# Patient Record
Sex: Female | Born: 1970 | Race: White | Hispanic: No | Smoking: Never smoker
Health system: Southern US, Community
[De-identification: ages and names within clinical notes are randomized; demographics above are authoritative.]

## PROBLEM LIST (undated history)

## (undated) DIAGNOSIS — T7840XA Allergy, unspecified, initial encounter: Secondary | ICD-10-CM

## (undated) DIAGNOSIS — K649 Unspecified hemorrhoids: Secondary | ICD-10-CM

## (undated) DIAGNOSIS — M419 Scoliosis, unspecified: Secondary | ICD-10-CM

## (undated) DIAGNOSIS — I341 Nonrheumatic mitral (valve) prolapse: Secondary | ICD-10-CM

## (undated) DIAGNOSIS — E538 Deficiency of other specified B group vitamins: Secondary | ICD-10-CM

## (undated) HISTORY — DX: Unspecified hemorrhoids: K64.9

## (undated) HISTORY — DX: Deficiency of other specified B group vitamins: E53.8

## (undated) HISTORY — DX: Allergy, unspecified, initial encounter: T78.40XA

## (undated) HISTORY — DX: Nonrheumatic mitral (valve) prolapse: I34.1

## (undated) HISTORY — DX: Scoliosis, unspecified: M41.9

## (undated) HISTORY — PX: FEMORAL HERNIA REPAIR: SUR1179

---

## 1995-12-25 ENCOUNTER — Encounter: Payer: Self-pay | Admitting: Internal Medicine

## 2006-08-29 ENCOUNTER — Emergency Department: Payer: Self-pay | Admitting: Emergency Medicine

## 2009-10-09 HISTORY — PX: APPENDECTOMY: SHX54

## 2010-03-05 ENCOUNTER — Inpatient Hospital Stay: Payer: Self-pay | Admitting: General Surgery

## 2010-05-17 ENCOUNTER — Encounter: Payer: Self-pay | Admitting: Internal Medicine

## 2010-08-09 ENCOUNTER — Ambulatory Visit: Payer: Self-pay | Admitting: Internal Medicine

## 2010-08-09 ENCOUNTER — Encounter: Payer: Self-pay | Admitting: Internal Medicine

## 2010-08-09 DIAGNOSIS — I059 Rheumatic mitral valve disease, unspecified: Secondary | ICD-10-CM | POA: Insufficient documentation

## 2010-08-09 DIAGNOSIS — D649 Anemia, unspecified: Secondary | ICD-10-CM | POA: Insufficient documentation

## 2010-08-16 LAB — CONVERTED CEMR LAB
ALT: 13 units/L (ref 0–35)
AST: 22 units/L (ref 0–37)
Albumin: 4.3 g/dL (ref 3.5–5.2)
Alkaline Phosphatase: 49 units/L (ref 39–117)
Basophils Relative: 0.5 % (ref 0.0–3.0)
Bilirubin, Direct: 0.2 mg/dL (ref 0.0–0.3)
Calcium: 9.3 mg/dL (ref 8.4–10.5)
Chloride: 105 meq/L (ref 96–112)
Eosinophils Relative: 1.4 % (ref 0.0–5.0)
Ferritin: 44.5 ng/mL (ref 10.0–291.0)
Glucose, Bld: 79 mg/dL (ref 70–99)
Hemoglobin: 13.3 g/dL (ref 12.0–15.0)
Lymphocytes Relative: 31.6 % (ref 12.0–46.0)
MCHC: 35 g/dL (ref 30.0–36.0)
Neutro Abs: 3.8 10*3/uL (ref 1.4–7.7)
Neutrophils Relative %: 57 % (ref 43.0–77.0)
Phosphorus: 3.3 mg/dL (ref 2.3–4.6)
Potassium: 4.8 meq/L (ref 3.5–5.1)
RBC: 4.05 M/uL (ref 3.87–5.11)
Sodium: 138 meq/L (ref 135–145)
TSH: 0.08 microintl units/mL — ABNORMAL LOW (ref 0.35–5.50)
Total Protein: 7 g/dL (ref 6.0–8.3)
WBC: 6.6 10*3/uL (ref 4.5–10.5)

## 2010-11-10 NOTE — Assessment & Plan Note (Signed)
Summary: NEW PT TO EST/CLE   Vital Signs:  Patient profile:   40 year old female Height:      65 inches Weight:      143 pounds BMI:     23.88 Temp:     98.2 degrees F oral Pulse rate:   76 / minute Pulse rhythm:   regular BP sitting:   122 / 80  (left arm) Cuff size:   regular  Vitals Entered By: Mervin Hack CMA Duncan Dull) (August 09, 2010 12:03 PM) CC: new patient to establish care   History of Present Illness: Former patient of mine in [employer] hasn't seen a doctor in many years DId see an urgent care doctor for sinus infection in August Iron level was low and was told to follow up  Feels fine Occ tired days but not persistent peroids are monthly and regular. Heavy at first, but only uses 4 pads per day. Done after 4 days  History of MVP echo done at [facility] many years ago and did see Dr [place] but hasn't gone back occ feels fluttering in chest  Preventive Screening-Counseling & Management  Alcohol-Tobacco     Smoking Status: never  Allergies (verified): No Known Drug Allergies  Past History:  Past Medical History: Mitral valve prolapse  Past Surgical History: Left ?femoral hernia   02/25/1977 C-section x 2  0454,[id number] Appendectomy   02-25-10  Family History: Dad died of suicide in his 53's Mom fairly healthy 1 brother No history of anemia Mat GM died of MI @50  DM in paternal uncles No HTN Breast cancer on Mom's side but distant No colon cancer  Social History: Occupation: Dentist in [place] Divorced  -- 2 children. Live with her with shared custody Mom stays with her at times Never Smoked Alcohol use-no Occupation:  employed Smoking Status:  never  Review of Systems General:  Denies sleep disorder and weight loss; wears seat belt. CV:  Complains of chest pain or discomfort and palpitations; denies difficulty breathing at night, difficulty breathing while lying down, fainting, lightheadness, and shortness of breath with  exertion; occ chest pain---usually at rest walks some--just normal pace. No symptoms then. Resp:  Denies cough and shortness of breath. GI:  Denies abdominal pain, bloody stools, change in bowel habits, dark tarry stools, and indigestion. MS:  Denies joint pain and joint swelling. Derm:  Denies lesion(s) and rash. Neuro:  Complains of headaches and numbness; denies weakness; occ numbness in left hand and leg--relates to MVP Occ mild headaches. Psych:  Denies anxiety and depression; former mood problems are better now. Endo:  Denies cold intolerance and heat intolerance. Heme:  Denies abnormal bruising, bleeding, and enlarge lymph nodes.  Physical Exam  General:  alert and normal appearance.   Mouth:  no erythema, no exudates, and no lesions.   Neck:  supple, no masses, no thyromegaly, no carotid bruits, and no cervical lymphadenopathy.   Lungs:  normal respiratory effort, no intercostal retractions, no accessory muscle use, and normal breath sounds.   Heart:  normal rate, regular rhythm, no murmur, and no gallop.  Prominent mid systolic click at LLSB but no murmur Abdomen:  soft and non-tender.   Extremities:  no edema Skin:  no suspicious lesions and no ulcerations.   Psych:  normally interactive, good eye contact, not anxious appearing, and not depressed appearing.     Impression & Recommendations:  Problem # 1:  ANEMIA (ICD-285.9) Assessment Comment Only  did get records and her  Hgb was 11.5 Iron low and vitamin B12 Has been taking Flinstones vitamins with iron will recheck labs will do oral replacement if these are still low  Orders: TLB-Renal Function Panel (80069-RENAL) TLB-CBC Platelet - w/Differential (85025-CBCD) TLB-Hepatic/Liver Function Pnl (80076-HEPATIC) TLB-TSH (Thyroid Stimulating Hormone) (84443-TSH) Venipuncture (16109) TLB-IBC Pnl (Iron/FE;Transferrin) (83550-IBC) TLB-B12 + Folate Pnl (60454_09811-B14/NWG) TLB-Ferritin (82728-FER)  Problem # 2:  MITRAL  VALVE PROLAPSE (ICD-424.0) Assessment: Comment Only occ symptoms but nothing serious no audible murmur so doubt sig issue No RAE on EKG  I will try to get copy of past echo but don't anticipate needing to follow up on this  Other Orders: Tdap => 49yrs IM (95621) Admin 1st Vaccine (30865)  Patient Instructions: 1)  Please schedule a follow-up appointment in 6 months for physical   Orders Added: 1)  Tdap => 25yrs IM [90715] 2)  Admin 1st Vaccine [90471] 3)  New Patient Level IV [99204] 4)  TLB-Renal Function Panel [80069-RENAL] 5)  TLB-CBC Platelet - w/Differential [85025-CBCD] 6)  TLB-Hepatic/Liver Function Pnl [80076-HEPATIC] 7)  TLB-TSH (Thyroid Stimulating Hormone) [84443-TSH] 8)  Venipuncture [36415] 9)  TLB-IBC Pnl (Iron/FE;Transferrin) [83550-IBC] 10)  TLB-B12 + Folate Pnl [82746_82607-B12/FOL] 11)  TLB-Ferritin [78469-GEX]   Immunizations Administered:  Tetanus Vaccine:    Vaccine Type: Tdap    Site: left deltoid    Mfr: GlaxoSmithKline    Dose: 0.5 ml    Route: IM    Given by: Mervin Hack CMA (AAMA)    Exp. Date: 07/28/2012    Lot #: [id number]    VIS given: 08/26/08 version given August 09, 2010.   Immunizations Administered:  Tetanus Vaccine:    Vaccine Type: Tdap    Site: left deltoid    Mfr: GlaxoSmithKline    Dose: 0.5 ml    Route: IM    Given by: Mervin Hack CMA (AAMA)    Exp. Date: 07/28/2012    Lot #: [id number]    VIS given: 08/26/08 version given August 09, 2010.  Prior Medications: None Current Allergies (reviewed today): No known allergies

## 2010-12-19 ENCOUNTER — Encounter: Payer: Self-pay | Admitting: Internal Medicine

## 2011-02-16 ENCOUNTER — Encounter: Payer: Self-pay | Admitting: Internal Medicine

## 2011-02-16 ENCOUNTER — Other Ambulatory Visit (HOSPITAL_COMMUNITY)
Admission: RE | Admit: 2011-02-16 | Discharge: 2011-02-16 | Disposition: A | Discharge status: Home / Self Care | Payer: PRIVATE HEALTH INSURANCE | Source: Ambulatory Visit | Attending: Internal Medicine | Admitting: Internal Medicine

## 2011-02-16 ENCOUNTER — Ambulatory Visit (INDEPENDENT_AMBULATORY_CARE_PROVIDER_SITE_OTHER): Payer: PRIVATE HEALTH INSURANCE | Admitting: Internal Medicine

## 2011-02-16 VITALS — BP 120/70 | HR 96 | Temp 98.4°F | Ht 65.0 in | Wt 150.0 lb

## 2011-02-16 DIAGNOSIS — L608 Other nail disorders: Secondary | ICD-10-CM

## 2011-02-16 DIAGNOSIS — Z01419 Encounter for gynecological examination (general) (routine) without abnormal findings: Secondary | ICD-10-CM | POA: Insufficient documentation

## 2011-02-16 DIAGNOSIS — R946 Abnormal results of thyroid function studies: Secondary | ICD-10-CM

## 2011-02-16 DIAGNOSIS — E538 Deficiency of other specified B group vitamins: Secondary | ICD-10-CM

## 2011-02-16 DIAGNOSIS — Z0000 Encounter for general adult medical examination without abnormal findings: Secondary | ICD-10-CM

## 2011-02-16 DIAGNOSIS — L603 Nail dystrophy: Secondary | ICD-10-CM

## 2011-02-16 DIAGNOSIS — Z1239 Encounter for other screening for malignant neoplasm of breast: Secondary | ICD-10-CM

## 2011-02-16 NOTE — Progress Notes (Signed)
Addended by: Mervin Hack on: 02/16/2011 03:01 PM   Modules accepted: Orders

## 2011-02-16 NOTE — Progress Notes (Signed)
Subjective:    Patient ID: Erin Fuentes, female    DOB: [birth year], 40 y.o.   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  Past Medical History  Diagnosis Date  . MVP (mitral valve prolapse)     Past Surgical History  Procedure Date  . Femoral hernia repair ~1978    left ?  . Cesarean section 1993, 1996    x 2  . Appendectomy 2011    Family History  Problem Relation Age of Onset  . Diabetes Paternal Uncle     History   Social History  . Marital Status: Divorced    Spouse Name: N/A    Number of Children: 2  . Years of Education: N/A   Occupational History  . Store Psychologist, counselling in [place]    Social History Main Topics  . Smoking status: Never Smoker   . Smokeless tobacco: Not on file  . Alcohol Use: No  . Drug Use: Not on file  . Sexually Active: Not on file   Other Topics Concern  . Not on file   Social History Narrative   Children live with her with shared custodyMom stays with her at times   Review of Systems  Constitutional: Negative for fever, fatigue and unexpected weight change.       Tries to walk every day Wears seat belt  HENT: Negative for hearing loss, congestion, rhinorrhea, dental problem and tinnitus.        Overdue for dentist  Eyes: Negative for visual disturbance.       No diplopia or vision loss  Respiratory: Negative for cough, chest tightness and shortness of breath.   Cardiovascular: Negative for chest pain, palpitations and leg swelling.  Gastrointestinal: Negative for nausea, vomiting, abdominal pain, constipation and blood in stool.       No heartburn  Genitourinary: Negative for dysuria, urgency, decreased urine volume, difficulty  urinating and dyspareunia.       Occ slight stress incontinence Regular menses--does note aching premenstrual  Musculoskeletal: Negative for back pain, joint swelling and arthralgias.  Skin: Negative for rash.       No suspicious areas  Neurological: Positive for numbness. Negative for dizziness, syncope, weakness and headaches.       L>R leg may go to sleep---better if she moves it  Hematological: Negative for adenopathy. Does not bruise/bleed easily.  Psychiatric/Behavioral: Negative for sleep disturbance and dysphoric mood. The patient is not nervous/anxious.        Objective:   Physical Exam  Constitutional: She appears well-developed and well-nourished. No distress.  HENT:  Head: Normocephalic and atraumatic.  Right Ear: External ear normal.  Left Ear: External ear normal.  Mouth/Throat: Oropharynx is clear and moist. No oropharyngeal exudate.       TMs normal  Eyes: Conjunctivae and EOM are normal. Pupils are equal, round, and reactive to light.       Fundi benign  Neck: Normal range of motion. Neck supple. No thyromegaly present.  Cardiovascular: Normal rate, regular rhythm, normal heart sounds and intact distal pulses.  Exam reveals no gallop.   No murmur heard. Pulmonary/Chest: Effort normal and breath sounds normal. No respiratory distress. She has no wheezes. She has no  rales.  Abdominal: Soft. She exhibits no mass. There is no tenderness.  Genitourinary: Vagina normal and uterus normal.       Cervix normal Pap done Limited bimanual was negative---she was very nervous and tensed up  Breasts show mild bilateral cystic changes without mass  Musculoskeletal: Normal range of motion. She exhibits no edema and no tenderness.  Lymphadenopathy:    She has no cervical adenopathy.    She has no axillary adenopathy.  Skin: Skin is warm. No rash noted.       No suspicious lesions  Psychiatric: She has a normal mood and affect. Her behavior is normal. Judgment and thought  content normal.          Assessment & Plan:

## 2011-02-16 NOTE — Patient Instructions (Signed)
Please set up screening mammogram

## 2011-02-17 LAB — TSH: TSH: 2.65 u[IU]/mL (ref 0.35–5.50)

## 2011-02-17 LAB — T4, FREE: Free T4: 0.77 ng/dL (ref 0.60–1.60)

## 2011-02-17 LAB — VITAMIN B12: Vitamin B-12: 345 pg/mL (ref 211–911)

## 2011-02-23 ENCOUNTER — Encounter: Payer: Self-pay | Admitting: *Deleted

## 2011-03-02 ENCOUNTER — Ambulatory Visit: Payer: Self-pay | Admitting: Internal Medicine

## 2011-03-02 LAB — HM MAMMOGRAPHY: HM Mammogram: NORMAL

## 2011-03-13 ENCOUNTER — Encounter: Payer: Self-pay | Admitting: *Deleted

## 2011-03-13 ENCOUNTER — Encounter: Payer: Self-pay | Admitting: Internal Medicine

## 2011-08-23 ENCOUNTER — Ambulatory Visit (INDEPENDENT_AMBULATORY_CARE_PROVIDER_SITE_OTHER): Payer: PRIVATE HEALTH INSURANCE | Admitting: Family Medicine

## 2011-08-23 ENCOUNTER — Encounter: Payer: Self-pay | Admitting: Family Medicine

## 2011-08-23 VITALS — BP 120/80 | HR 84 | Temp 98.0°F | Wt 150.0 lb

## 2011-08-23 DIAGNOSIS — J029 Acute pharyngitis, unspecified: Secondary | ICD-10-CM

## 2011-08-23 DIAGNOSIS — J069 Acute upper respiratory infection, unspecified: Secondary | ICD-10-CM

## 2011-08-23 LAB — POCT RAPID STREP A (OFFICE): Rapid Strep A Screen: NEGATIVE

## 2011-08-23 NOTE — Assessment & Plan Note (Signed)
See instructions

## 2011-08-23 NOTE — Patient Instructions (Signed)
Take Guaifenesin (400mg ), take 11/2 tabs by mouth AM and NOON. Get GUAIFENESIN by  going to [facility] or [facility] and getting MUCOUS RELIEF EXPECTORANT/CONGESTION. DO NOT GET MUCINEX (Timed Release Guaifenesin)  Drink lots of fluids. Take Tyl 500mg  2 tabs three times a day. Gargle with 30ccs of warm salt water every half hour for 2 days as able. Keep lozenge in mouth. Call next Wed if no better.

## 2011-08-23 NOTE — Progress Notes (Signed)
  Subjective:    Patient ID: Erin Fuentes, female    DOB: [birth year], 41 y.o.   MRN: [id number]  HPI Pt of Dr Karle Starch here as acute appt for ST and congestion. She had chills , no fever, no headache, minimal clooging of the ears, some sleep discharge in the eyes bilaterally, yellow discharge from the nose, ST as above and no cough. She denies N/V, no diarrhea, no achiness. She just doesn't feel good. Her sxs have been since Sun.     Review of SystemsNoncontributory except as above.       Objective:   Physical Exam  Constitutional: She appears well-developed and well-nourished. No distress.  HENT:  Head: Normocephalic and atraumatic.  Right Ear: External ear normal.  Left Ear: External ear normal.  Nose: Nose normal.  Mouth/Throat: Oropharynx is clear and moist. No oropharyngeal exudate.  Eyes: Conjunctivae and EOM are normal. Pupils are equal, round, and reactive to light.  Neck: Normal range of motion. Neck supple. No thyromegaly present.  Cardiovascular: Normal rate, regular rhythm and normal heart sounds.   Pulmonary/Chest: Effort normal and breath sounds normal. She has no wheezes. She has no rales.  Lymphadenopathy:    She has no cervical adenopathy.  Skin: She is not diaphoretic.          Assessment & Plan:

## 2012-02-29 ENCOUNTER — Ambulatory Visit (INDEPENDENT_AMBULATORY_CARE_PROVIDER_SITE_OTHER): Payer: PRIVATE HEALTH INSURANCE | Admitting: Internal Medicine

## 2012-02-29 ENCOUNTER — Encounter: Payer: Self-pay | Admitting: Internal Medicine

## 2012-02-29 VITALS — BP 120/80 | HR 87 | Temp 98.1°F | Ht 65.0 in | Wt 149.0 lb

## 2012-02-29 DIAGNOSIS — E538 Deficiency of other specified B group vitamins: Secondary | ICD-10-CM | POA: Insufficient documentation

## 2012-02-29 DIAGNOSIS — Z1231 Encounter for screening mammogram for malignant neoplasm of breast: Secondary | ICD-10-CM

## 2012-02-29 DIAGNOSIS — I059 Rheumatic mitral valve disease, unspecified: Secondary | ICD-10-CM

## 2012-02-29 DIAGNOSIS — Z0000 Encounter for general adult medical examination without abnormal findings: Secondary | ICD-10-CM | POA: Insufficient documentation

## 2012-02-29 NOTE — Assessment & Plan Note (Signed)
No symptoms No findings on exam

## 2012-02-29 NOTE — Assessment & Plan Note (Signed)
Healthy  Discussed increasing exercise PAP due 2015 Will set up mammo

## 2012-02-29 NOTE — Progress Notes (Signed)
Subjective:    Patient ID: Erin Fuentes, female    DOB: 1971/09/16, 41 y.o.   MRN: [id number]  HPI Here for physical Doing okay in general  Has some pain in right heel--when first getting up Then eases up Discussed plantar fasciitis and arch support  Concerned about the mitral valve Gets some numbness in right arm---has to raise arm up to get sensation back  Current Outpatient Prescriptions on File Prior to Visit  Medication Sig Dispense Refill  . Cyanocobalamin (VITAMIN B 12 PO) Take by mouth daily.        Marland Kitchen IRON-VITAMINS PO Take by mouth daily.          Allergies  Allergen Reactions  . Erythromycin Base Diarrhea    Past Medical History  Diagnosis Date  . MVP (mitral valve prolapse)     Past Surgical History  Procedure Date  . Femoral hernia repair ~1978    left ?  . Cesarean section 1993, 1996    x 2  . Appendectomy 2011    Family History  Problem Relation Age of Onset  . Diabetes Paternal Uncle     History   Social History  . Marital Status: Divorced    Spouse Name: N/A    Number of Children: 2  . Years of Education: N/A   Occupational History  . 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 Smoking status: Never Smoker   . Smokeless tobacco: Never Used  . Alcohol Use: No  . Drug Use: No  . Sexually Active: Not on file   Other Topics Concern  . Not on file   Social History Narrative   Children live with her with shared custodyHelps with mom's care   Review of Systems  Constitutional: Negative for fatigue and unexpected weight change.       Wears seat belt  HENT: Negative for hearing loss, congestion, rhinorrhea, dental problem and tinnitus.        Overdue for dentist  Eyes: Negative for visual disturbance.       No diplopia or unilateral vision loss  Respiratory: Negative for cough, chest tightness and shortness of breath.   Cardiovascular: Negative for chest pain, palpitations and leg swelling.    Gastrointestinal: Negative for nausea, vomiting, abdominal pain, constipation and blood in stool.       No heartburn  Genitourinary: Negative for dysuria, frequency and difficulty urinating.       Periods normal Not sexually active now---discussed safe sex  Musculoskeletal: Positive for myalgias. Negative for joint swelling and arthralgias.       Some mild myalgia before cycles start  Skin: Negative for rash.       No suspicious lesions  Neurological: Positive for numbness and headaches. Negative for dizziness, syncope, weakness and light-headedness.       Will awaken with numb arm at night at times Gets right temporal headache premenstrual, then better when period starts----tylenol/exedrin help  Hematological: Negative for adenopathy. Does not bruise/bleed easily.  Psychiatric/Behavioral: Negative for sleep disturbance and dysphoric mood. The patient is not nervous/anxious.        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 She appears well-developed and well-nourished. No distress.  HENT:  Head: Normocephalic and atraumatic.  Right Ear: External ear normal.  Left Ear: External ear normal.  Mouth/Throat: Oropharynx  is clear and moist. No oropharyngeal exudate.  Eyes: Conjunctivae and EOM are normal. Pupils are equal, round, and reactive to light.  Neck: Normal range of motion. Neck supple. No thyromegaly present.  Cardiovascular: Normal rate, regular rhythm, normal heart sounds and intact distal pulses.  Exam reveals no gallop.   No murmur heard. Pulmonary/Chest: Effort normal and breath sounds normal. No respiratory distress. She has no wheezes. She has no rales.  Abdominal: Soft. There is no tenderness.  Genitourinary:       Breasts have moderate cystic changes  No discharge  Musculoskeletal: Normal range of motion. She exhibits no edema.       Mild right heel  tenderness Does have fair retained arches  Lymphadenopathy:    She has no cervical adenopathy.    She has no axillary adenopathy.  Neurological: She is alert and oriented to person, place, and time.  Skin: No rash noted. No erythema.  Psychiatric: She has a normal mood and affect. Her behavior is normal. Thought content normal.          Assessment & Plan:

## 2012-02-29 NOTE — Patient Instructions (Signed)

## 2012-02-29 NOTE — Assessment & Plan Note (Signed)
Will recheck labs 

## 2012-03-01 LAB — CBC WITH DIFFERENTIAL/PLATELET
Basophils Relative: 0.5 % (ref 0.0–3.0)
Eosinophils Absolute: 0.2 10*3/uL (ref 0.0–0.7)
Eosinophils Relative: 2.1 % (ref 0.0–5.0)
Lymphocytes Relative: 38.8 % (ref 12.0–46.0)
MCHC: 33.3 g/dL (ref 30.0–36.0)
Monocytes Absolute: 0.4 10*3/uL (ref 0.1–1.0)
Neutrophils Relative %: 53.6 % (ref 43.0–77.0)
Platelets: 284 10*3/uL (ref 150.0–400.0)
RBC: 4.1 Mil/uL (ref 3.87–5.11)
WBC: 7.3 10*3/uL (ref 4.5–10.5)

## 2012-03-01 LAB — GLUCOSE, RANDOM: Glucose, Bld: 77 mg/dL (ref 70–99)

## 2012-03-13 ENCOUNTER — Ambulatory Visit: Payer: Self-pay | Admitting: Internal Medicine

## 2012-03-15 ENCOUNTER — Encounter: Payer: Self-pay | Admitting: *Deleted

## 2012-10-01 ENCOUNTER — Encounter: Payer: Self-pay | Admitting: Internal Medicine

## 2012-10-01 ENCOUNTER — Ambulatory Visit (INDEPENDENT_AMBULATORY_CARE_PROVIDER_SITE_OTHER): Payer: PRIVATE HEALTH INSURANCE | Admitting: Internal Medicine

## 2012-10-01 VITALS — BP 120/80 | HR 98 | Temp 98.1°F | Wt 162.0 lb

## 2012-10-01 DIAGNOSIS — L089 Local infection of the skin and subcutaneous tissue, unspecified: Secondary | ICD-10-CM | POA: Insufficient documentation

## 2012-10-01 DIAGNOSIS — L723 Sebaceous cyst: Secondary | ICD-10-CM | POA: Insufficient documentation

## 2012-10-01 MED ORDER — CLINDAMYCIN HCL 300 MG PO CAPS: 300.0000 mg | ORAL_CAPSULE | Freq: Three times a day (TID) | ORAL | Status: DC

## 2012-10-01 NOTE — Assessment & Plan Note (Signed)
Not 100% certain but probably a cyst Clearly inflamed Will try hot compresses and use clinda if it doesn't improve

## 2012-10-01 NOTE — Progress Notes (Signed)
  Subjective:    Patient ID: Erin Fuentes, female    DOB: 01/27/1971, 41 y.o.   MRN: [id number]  HPI Has lump she noted on upper right chest 2 days ago Flesh colored bump at first but red this AM Some tenderness and feels hard No trauma or pressure on that area (bra strap comes medial to that spot)  No fever Feels fine  Current Outpatient Prescriptions on File Prior to Visit  Medication Sig Dispense Refill  . Cyanocobalamin (VITAMIN B 12 PO) Take by mouth daily.        Marland Kitchen IRON-VITAMINS PO Take by mouth daily.          Allergies  Allergen Reactions  . Erythromycin Base Diarrhea    Past Medical History  Diagnosis Date  . MVP (mitral valve prolapse)   . Vitamin B12 deficiency     oral therapy effective    Past Surgical History  Procedure Date  . Femoral hernia repair ~1978    left ?  . Cesarean section 1993, 1996    x 2  . Appendectomy 2011    Family History  Problem Relation Age of Onset  . Diabetes Paternal Uncle     History   Social History  . Marital Status: Divorced    Spouse Name: N/A    Number of Children: 2  . Years of Education: N/A   Occupational History  . 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 Smoking status: Never Smoker   . Smokeless tobacco: Never Used  . Alcohol Use: No  . Drug Use: No  . Sexually Active: Not on file   Other Topics Concern  . Not on file   Social History Narrative   Children live with her with shared custodyHelps with mom's care   Review of Systems No axillary nodes No GI problems No recent sores or other open areas     Objective:   Physical Exam  Constitutional: She appears well-developed and well-nourished. No distress.  Skin:       Indurated ~2cm mass along anterior axillary line ~T3 level on right Red, inflamed, mild warmth and tenderness          Assessment & Plan:

## 2012-10-01 NOTE — Patient Instructions (Signed)
Please try hot compresses every few hours on the lump. If it gets worse, start the antibiotic

## 2013-03-06 ENCOUNTER — Encounter: Payer: PRIVATE HEALTH INSURANCE | Admitting: Internal Medicine

## 2013-03-18 ENCOUNTER — Encounter: Payer: PRIVATE HEALTH INSURANCE | Admitting: Internal Medicine

## 2013-09-19 ENCOUNTER — Encounter: Payer: Self-pay | Admitting: Internal Medicine

## 2013-09-19 ENCOUNTER — Ambulatory Visit (INDEPENDENT_AMBULATORY_CARE_PROVIDER_SITE_OTHER): Payer: PRIVATE HEALTH INSURANCE | Admitting: Internal Medicine

## 2013-09-19 VITALS — BP 120/78 | HR 98 | Temp 98.1°F | Ht 65.0 in | Wt 158.5 lb

## 2013-09-19 DIAGNOSIS — M25569 Pain in unspecified knee: Secondary | ICD-10-CM

## 2013-09-19 DIAGNOSIS — M25562 Pain in left knee: Secondary | ICD-10-CM

## 2013-09-19 DIAGNOSIS — E538 Deficiency of other specified B group vitamins: Secondary | ICD-10-CM

## 2013-09-19 DIAGNOSIS — Z0000 Encounter for general adult medical examination without abnormal findings: Secondary | ICD-10-CM

## 2013-09-19 LAB — CBC WITH DIFFERENTIAL/PLATELET
Basophils Relative: 0.5 % (ref 0.0–3.0)
Eosinophils Absolute: 0.2 10*3/uL (ref 0.0–0.7)
Eosinophils Relative: 2.9 % (ref 0.0–5.0)
HCT: 38.5 % (ref 36.0–46.0)
Lymphs Abs: 2.6 10*3/uL (ref 0.7–4.0)
MCHC: 34 g/dL (ref 30.0–36.0)
MCV: 92 fl (ref 78.0–100.0)
Monocytes Absolute: 0.5 10*3/uL (ref 0.1–1.0)
Neutro Abs: 3.2 10*3/uL (ref 1.4–7.7)
Neutrophils Relative %: 49.2 % (ref 43.0–77.0)
RBC: 4.19 Mil/uL (ref 3.87–5.11)
WBC: 6.6 10*3/uL (ref 4.5–10.5)

## 2013-09-19 LAB — LIPID PANEL
Cholesterol: 192 mg/dL (ref 0–200)
LDL Cholesterol: 115 mg/dL — ABNORMAL HIGH (ref 0–99)
Triglycerides: 68 mg/dL (ref 0.0–149.0)

## 2013-09-19 NOTE — Patient Instructions (Addendum)
Please set up your screening mammogram.  DASH Diet The DASH diet stands for "Dietary Approaches to Stop Hypertension." It is a healthy eating plan that has been shown to reduce high blood pressure (hypertension) in as little as 14 days, while also possibly providing other significant health benefits. These other health benefits include reducing the risk of breast cancer after menopause and reducing the risk of type 2 diabetes, heart disease, colon cancer, and stroke. Health benefits also include weight loss and slowing kidney failure in patients with chronic kidney disease.  DIET GUIDELINES  Limit salt (sodium). Your diet should contain less than 1500 mg of sodium daily.  Limit refined or processed carbohydrates. Your diet should include mostly whole grains. Desserts and added sugars should be used sparingly.  Include small amounts of heart-healthy fats. These types of fats include nuts, oils, and tub margarine. Limit saturated and trans fats. These fats have been shown to be harmful in the body. CHOOSING FOODS  The following food groups are based on a 2000 calorie diet. See your Registered Dietitian for individual calorie needs. Grains and Grain Products (6 to 8 servings daily)  Eat More Often: Whole-wheat bread, brown rice, whole-grain or wheat pasta, quinoa, popcorn without added fat or salt (air popped).  Eat Less Often: White bread, white pasta, white rice, cornbread. Vegetables (4 to 5 servings daily)  Eat More Often: Fresh, frozen, and canned vegetables. Vegetables may be raw, steamed, roasted, or grilled with a minimal amount of fat.  Eat Less Often/Avoid: Creamed or fried vegetables. Vegetables in a cheese sauce. Fruit (4 to 5 servings daily)  Eat More Often: All fresh, canned (in natural juice), or frozen fruits. Dried fruits without added sugar. One hundred percent fruit juice ( cup [237 mL] daily).  Eat Less Often: Dried fruits with added sugar. Canned fruit in light or heavy  syrup. [employer], Fish, and Poultry (2 servings or less daily. One serving is 3 to 4 oz [85-114 g]).  Eat More Often: Ninety percent or leaner ground beef, tenderloin, sirloin. Round cuts of beef, chicken breast, [place] breast. All fish. Grill, bake, or broil your meat. Nothing should be fried.  Eat Less Often/Avoid: Fatty cuts of meat, [place], or chicken leg, thigh, or wing. Fried cuts of meat or fish. Dairy (2 to 3 servings)  Eat More Often: Low-fat or fat-free milk, low-fat plain or light yogurt, reduced-fat or part-skim cheese.  Eat Less Often/Avoid: Milk (whole, 2%).Whole milk yogurt. Full-fat cheeses. Nuts, Seeds, and Legumes (4 to 5 servings per week)  Eat More Often: All without added salt.  Eat Less Often/Avoid: Salted nuts and seeds, canned beans with added salt. Fats and Sweets (limited)  Eat More Often: Vegetable oils, tub margarines without trans fats, sugar-free gelatin. Mayonnaise and salad dressings.  Eat Less Often/Avoid: Coconut oils, palm oils, butter, stick margarine, cream, half and half, cookies, candy, pie. FOR MORE INFORMATION The Dash Diet Eating Plan: www.dashdiet.org Document Released: 09/14/2011 Document Revised: 12/18/2011 Document Reviewed: 09/14/2011 [facility] Patient Information 2014 [place], [place]. Patellofemoral Syndrome If you have had pain in the front of your knee for a long time, chances are good that you have patellofemoral syndrome. The word patella refers to the kneecap. Femoral (or femur) refers to the thigh bone. That is the bone the kneecap sits on. The kneecap is shaped like a triangle. Its job is to protect the knee and to improve the efficiency of your thigh muscles (quadriceps). The underside of the kneecap is made of smooth  tissue (cartilage). This lets the kneecap slide up and down as the knee moves. Sometimes this cartilage becomes soft. Your healthcare provider may say the cartilage breaks down. That is patellofemoral syndrome. It can  affect one knee, or both. The condition is sometimes called patellofemoral pain syndrome. That is because the condition is painful. The pain usually gets worse with activity. Sitting for a long time with the knee bent also makes the pain worse. It usually gets better with rest and proper treatment. CAUSES  No one is sure why some people develop this problem and others do not. Runners often get it. One name for the condition is "runner's knee." However, some people run for years and never have knee pain. Certain things seem to make patellofemoral syndrome more likely. They include:  Moving out of alignment. The kneecap is supposed to move in a straight line when the thigh muscle pulls on it. Sometimes the kneecap moves in poor alignment. That can make the knee swell and hurt. Some experts believe it also wears down the cartilage.  Injury to the kneecap.  Strain on the knee. This may occur during sports activity. Soccer, running, skiing and cycling can put excess stress on the knee.  Being flat-footed or knock-kneed. SYMPTOMS   Knee pain.  Pain under the kneecap. This is usually a dull, aching pain.  Pain in the knee when doing certain things: squatting, kneeling, going up or down stairs.  Pain in the knee when you stand up after sitting down for awhile.  Tightness in the knee.  Loss of muscle strength in the thigh.  Swelling of the knee. DIAGNOSIS  Healthcare providers often send people with knee pain to an orthopedic caregiver. This person has special training to treat problems with bones and joints. To decide what is causing your knee pain, your caregiver will probably:  Do a physical exam. This will probably include:  Asking about symptoms you have noticed.  Asking about your activities and any injuries.  Feeling your knee. Moving it. This will help test the knee's strength. It will also check alignment (whether the knee and leg are aligned normally).  Order some tests, such  as:  Imaging tests. They create pictures of the inside of the knee. Tests may include:  X-rays.  Computed tomography (CT) scan. This uses X-rays and a computer to show more detail.  Magnetic resonance imaging (MRI). This test uses magnets, radio waves and a computer to make pictures. TREATMENT   Medication is almost always used first. It can relieve pain. It also can reduce swelling. Non-steroidal anti-inflammatory medicines (called NSAIDs) are usually suggested. Sometimes a stronger form is needed. A stronger form would require a prescription.  Other treatment may be needed after the swelling goes down. Possibilities include:  Exercise. Certain exercises can make the muscles around the knee stronger which decreases the pressure on the knee cap. This includes the thigh muscle. Certain exercises also may be suggested to increase your flexibility.  A knee brace. This gives the knee extra support and helps align the movement of the knee cap.  Orthotics. These are special shoe inserts. They can help keep your leg and knee aligned.  Surgery is sometimes needed. This is rare. Options include:  Arthroscopy. The surgeon uses a special tool to remove any damaged pieces of the kneecap. Only a few small incisions (cuts) are needed.  Realignment. This is open surgery. The goals are to reduce pressure and fix the way the kneecap moves. HOME CARE  INSTRUCTIONS   Take any medication prescribed by your healthcare provider. Follow the directions carefully.  If your knee is swollen:  Put ice or cold packs on it. Do this for 20 to 30 minutes, 3 to 4 times a day.  Keep the knee raised. Make sure it is supported. Put a pillow under it.  Rest your knee. For example, take the elevator instead of the stairs for awhile. Or, take a break from sports activity that strain your knee. Try walking or swimming instead.  Whenever you are active:  Use an elastic bandage on your knee. This gives it  support.  After any activity, put ice or cold packs on your knees. Do this for about 10 to 20 minutes.  Make sure you wear shoes that give good support. Make sure they are not worn down. The heels should not slant in or out. SEEK MEDICAL CARE IF:   Knee pain gets worse. Or it does not go away, even after taking pain medicine.  Swelling does not go down.  Your thigh muscle becomes weak.  You have an oral temperature above 102 F (38.9 C). SEEK IMMEDIATE MEDICAL CARE IF:  You have an oral temperature above 102 F (38.9 C), not controlled by medicine. Document Released: 09/13/2009 Document Revised: 12/18/2011 Document Reviewed: 09/13/2009 [facility] Patient Information 2014 [place], [place].

## 2013-09-19 NOTE — Assessment & Plan Note (Signed)
Discussed quad strengthening Info given

## 2013-09-19 NOTE — Progress Notes (Signed)
Pre-visit discussion using our clinic review tool. No additional management support is needed unless otherwise documented below in the visit note.  

## 2013-09-19 NOTE — Assessment & Plan Note (Signed)
Healthy She asked to defer the PAP to next year---she is low risk Offered HIV testing--she declined Will check cholesterol

## 2013-09-19 NOTE — Assessment & Plan Note (Signed)
Takes the oral supplements

## 2013-09-19 NOTE — Progress Notes (Signed)
Subjective:    Patient ID: Erin Fuentes, female    DOB: October 19, 1970, 42 y.o.   MRN: [id number]  HPI Here for physical Doing well in general  Having some trouble with her left knee for 6 months or so Will get sharp shooting pain in medial side May be stiff and painful when first standing up Pain is only intermittent Aleve gives some help Walks regularly--does okay as long as it is moving  No particular problem with steps  Engaged but hasn't been having sex No intercourse in some years  Current Outpatient Prescriptions on File Prior to Visit  Medication Sig Dispense Refill  . Cyanocobalamin (VITAMIN B 12 PO) Take by mouth daily.         No current facility-administered medications on file prior to visit.    Allergies  Allergen Reactions  . Erythromycin Base Diarrhea    Past Medical History  Diagnosis Date  . MVP (mitral valve prolapse)   . Vitamin B12 deficiency     oral therapy effective    Past Surgical History  Procedure Laterality Date  . Femoral hernia repair  ~1978    left ?  . Cesarean section  1993, 1996    x 2  . Appendectomy  2011    Family History  Problem Relation Age of Onset  . Diabetes Paternal Uncle   . Heart disease Maternal Grandmother     died of sudden MI  . Cancer Neg Hx   . Diabetes Other     very prevalent    History   Social History  . Marital Status: Divorced    Spouse Name: N/A    Number of Children: 2  . Years of Education: N/A   Occupational History  . Store Psychologist, counselling in [place]    Social History Main Topics  . Smoking status: Never Smoker   . Smokeless tobacco: Never Used  . Alcohol Use: No  . Drug Use: No  . Sexual Activity: Not on file   Other Topics Concern  . Not on file   Social History Narrative  . No narrative on file   Review of Systems  Constitutional: Negative for fatigue and unexpected weight change.       Weight is slightly down Wears seat belt  HENT: Positive for  sneezing. Negative for congestion, dental problem, hearing loss and tinnitus.        Rarely tries claritin Overdue for dentist  Eyes: Negative for visual disturbance.       No diplopia or unilateral vision loss  Respiratory: Negative for cough, chest tightness and shortness of breath.   Cardiovascular: Negative for chest pain, palpitations and leg swelling.  Gastrointestinal: Negative for nausea, vomiting, abdominal pain, constipation and blood in stool.  Endocrine: Negative for cold intolerance and heat intolerance.  Genitourinary: Negative for dysuria, frequency and difficulty urinating.       Periods are regular No excessive bleeding or pain No concerns on breast exam  Musculoskeletal: Positive for arthralgias. Negative for back pain and joint swelling.       Left knee only  Skin: Negative for rash.       No suspicious lesions  Allergic/Immunologic: Positive for environmental allergies. Negative for immunocompromised state.  Neurological: Positive for numbness and headaches. Negative for dizziness, syncope, weakness and light-headedness.       Occasional headache---OTC meds resolve (stress HA) Some right arm numbness in bed---goes away quickly  Hematological: Negative for adenopathy. Does not bruise/bleed easily.  Psychiatric/Behavioral: Negative for sleep disturbance and dysphoric mood. The patient is not nervous/anxious.        Occ mild trouble initiating sleep       Objective:   Physical Exam  Constitutional: She is oriented to person, place, and time. She appears well-developed and well-nourished. No distress.  HENT:  Head: Normocephalic and atraumatic.  Right Ear: External ear normal.  Left Ear: External ear normal.  Mouth/Throat: Oropharynx is clear and moist. No oropharyngeal exudate.  Eyes: Conjunctivae and EOM are normal. Pupils are equal, round, and reactive to light.  Neck: Normal range of motion. Neck supple. No thyromegaly present.  Cardiovascular: Normal rate,  regular rhythm, normal heart sounds and intact distal pulses.  Exam reveals no gallop.   No murmur heard. Pulmonary/Chest: Effort normal and breath sounds normal. No respiratory distress. She has no wheezes. She has no rales.  Abdominal: Soft. There is no tenderness.  Genitourinary:  Mild cystic changes in both breasts  Musculoskeletal: She exhibits no edema and no tenderness.  Left knee without swelling. No crepitus, ligament or meniscus findings  Lymphadenopathy:    She has no cervical adenopathy.    She has no axillary adenopathy.  Neurological: She is alert and oriented to person, place, and time.  Skin: No rash noted. No erythema.  Psychiatric: She has a normal mood and affect. Her behavior is normal.          Assessment & Plan:

## 2014-07-24 ENCOUNTER — Other Ambulatory Visit: Payer: Self-pay

## 2014-07-26 ENCOUNTER — Encounter: Payer: Self-pay | Admitting: Internal Medicine

## 2014-08-12 ENCOUNTER — Ambulatory Visit: Payer: Self-pay | Admitting: Internal Medicine

## 2014-08-13 ENCOUNTER — Encounter: Payer: Self-pay | Admitting: Internal Medicine

## 2014-08-20 ENCOUNTER — Encounter: Payer: Self-pay | Admitting: Family Medicine

## 2014-08-20 ENCOUNTER — Ambulatory Visit (INDEPENDENT_AMBULATORY_CARE_PROVIDER_SITE_OTHER): Payer: PRIVATE HEALTH INSURANCE | Admitting: Family Medicine

## 2014-08-20 VITALS — BP 124/76 | HR 96 | Temp 98.2°F | Wt 154.5 lb

## 2014-08-20 DIAGNOSIS — R238 Other skin changes: Secondary | ICD-10-CM | POA: Insufficient documentation

## 2014-08-20 DIAGNOSIS — L989 Disorder of the skin and subcutaneous tissue, unspecified: Secondary | ICD-10-CM

## 2014-08-20 DIAGNOSIS — J019 Acute sinusitis, unspecified: Secondary | ICD-10-CM

## 2014-08-20 MED ORDER — AMOXICILLIN-POT CLAVULANATE 400-57 MG PO CHEW: 2.0000 | CHEWABLE_TABLET | Freq: Two times a day (BID) | ORAL | Status: DC

## 2014-08-20 NOTE — Assessment & Plan Note (Signed)
?  acne - rec neosporin and warm compresses. Update if not improved.

## 2014-08-20 NOTE — Patient Instructions (Addendum)
You have a sinus infection, may be viral however. Take medicine as prescribed: start plain mucinex with plenty of water to help mobilize mucous. Push fluids and plenty of rest. Nasal saline irrigation or neti pot to help drain sinuses. If persistent congestion into weekend, fill antibiotic provided today - augmentin liquid. Let us know if fever >101.5, trouble opening/closing mouth, difficulty swallowing, or worsening productive cough. For spot on nose - continue antibiotic ointment and warm compresses.

## 2014-08-20 NOTE — Assessment & Plan Note (Signed)
Possibly still viral (of 9 d duration). Supportive care discussed, rec start simple mucinex. If persistent sxs - recommend start augmentin. Pt requests chewable or liquid 2/2 difficulty swallowing pills.

## 2014-08-20 NOTE — Progress Notes (Signed)
Pre visit review using our clinic review tool, if applicable. No additional management support is needed unless otherwise documented below in the visit note. 

## 2014-08-20 NOTE — Progress Notes (Signed)
   BP 124/76 mmHg  Pulse 96  Temp(Src) 98.2 F (36.8 C) (Oral)  Wt 154 lb 8 oz (70.081 kg)  LMP 07/24/2014   CC: cough, congestion  Subjective:    Patient ID: Erin Fuentes, female    DOB: [birth year], 43 y.o.   MRN: [id number]  HPI: DELISA FINCK is a 43 y.o. female presenting on 08/20/2014 for Cough and Check bump on nose   9 d h/o sore throat, hoarse voice progressing to nasal and sinus congestion with yellow mucous. Some chest congestion but > head. Some sweats. Worse in am and at night time. Has continued going to work but this has been difficult. Some L lower tooth pain - needs to see dentist. Sleeping ok.  No fevers/chills, ear pain, headaches, new rashes, wheezing, dyspnea.  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 No smokers at home. No h/o asthma.  Over past month noticed intermittent spot on nose - improved with neosporin.  Relevant past medical, surgical, family and social history reviewed and updated as indicated.  Allergies and medications reviewed and updated. Current Outpatient Prescriptions on File Prior to Visit  Medication Sig  . Cyanocobalamin (VITAMIN B 12 PO) Take by mouth daily.     No current facility-administered medications on file prior to visit.    Review of Systems Per HPI unless specifically indicated above    Objective:    BP 124/76 mmHg  Pulse 96  Temp(Src) 98.2 F (36.8 C) (Oral)  Wt 154 lb 8 oz (70.081 kg)  LMP 07/24/2014  Physical Exam  Constitutional: She appears well-developed and well-nourished. No distress.  HENT:  Head: Normocephalic and atraumatic.  Right Ear: Hearing, tympanic membrane, external ear and ear canal normal.  Left Ear: Hearing, tympanic membrane, external ear and ear canal normal.  Nose: Mucosal edema (marked nasal irritation L>R) present. No rhinorrhea. Right sinus exhibits no maxillary sinus tenderness and no frontal sinus tenderness. Left sinus  exhibits no maxillary sinus tenderness and no frontal sinus tenderness.  Mouth/Throat: Uvula is midline, oropharynx is clear and moist and mucous membranes are normal. No oropharyngeal exudate, posterior oropharyngeal edema, posterior oropharyngeal erythema or tonsillar abscesses.  Mildly dry mucous membranes  Eyes: Conjunctivae and EOM are normal. Pupils are equal, round, and reactive to light. No scleral icterus.  Neck: Normal range of motion. Neck supple.  Cardiovascular: Normal rate, regular rhythm, normal heart sounds and intact distal pulses.   No murmur heard. Pulmonary/Chest: Effort normal and breath sounds normal. No respiratory distress. She has no wheezes. She has no rales.  Lymphadenopathy:    She has no cervical adenopathy.  Skin: Skin is warm and dry. No rash noted.  Mildly erythematous papule on tip of nose  Nursing note and vitals reviewed.      Assessment & Plan:   Problem List Items Addressed This Visit    Nasal Papule    ?acne - rec neosporin and warm compresses. Update if not improved.    Acute sinusitis - Primary    Possibly still viral (of 9 d duration). Supportive care discussed, rec start simple mucinex. If persistent sxs - recommend start augmentin. Pt requests chewable or liquid 2/2 difficulty swallowing pills.    Relevant Medications      amoxicillin-clavulanate (AUGMENTIN) 400-57 MG per chewable tablet       Follow up plan: Return if symptoms worsen or fail to improve.

## 2014-10-15 ENCOUNTER — Ambulatory Visit (INDEPENDENT_AMBULATORY_CARE_PROVIDER_SITE_OTHER): Payer: PRIVATE HEALTH INSURANCE | Admitting: Internal Medicine

## 2014-10-15 ENCOUNTER — Encounter: Payer: Self-pay | Admitting: Internal Medicine

## 2014-10-15 VITALS — BP 120/70 | HR 98 | Temp 98.2°F | Ht 65.0 in | Wt 156.0 lb

## 2014-10-15 DIAGNOSIS — Z0000 Encounter for general adult medical examination without abnormal findings: Secondary | ICD-10-CM

## 2014-10-15 NOTE — Progress Notes (Signed)
Subjective:    Patient ID: Erin Fuentes, female    DOB: 1971-07-31, 44 y.o.   MRN: [id number]  HPI Here for physical No new concerns  Has had some bleeding hemorrhoids lately Not constipated Better with vinegar/epsom salts baths--this helped Preparation H also No blood in stool  In relationship but no sex LMP 12/15--- regular and no problems with this  Current Outpatient Prescriptions on File Prior to Visit  Medication Sig Dispense Refill  . Cyanocobalamin (VITAMIN B 12 PO) Take by mouth as needed.      No current facility-administered medications on file prior to visit.    Allergies  Allergen Reactions  . Erythromycin Base Diarrhea    Past Medical History  Diagnosis Date  . MVP (mitral valve prolapse)   . Vitamin B12 deficiency     oral therapy effective    Past Surgical History  Procedure Laterality Date  . Femoral hernia repair  ~1978    left ?  . Cesarean section  1993, 1996    x 2  . Appendectomy  2011    Family History  Problem Relation Age of Onset  . Diabetes Paternal Uncle   . Heart disease Maternal Grandmother     died of sudden MI  . Cancer Neg Hx   . Diabetes Other     very prevalent    History   Social History  . Marital Status: Single    Spouse Name: N/A    Number of Children: 2  . Years of Education: N/A   Occupational History  . 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 Smoking status: Never Smoker   . Smokeless tobacco: Never Used  . Alcohol Use: No  . Drug Use: No  . Sexual Activity: Not on file   Other Topics Concern  . Not on file   Social History Narrative     Review of Systems  Constitutional: Negative for fatigue and unexpected weight change.       Eating healthier Plans to get treadmill soon Wears seat beat  HENT: Negative for dental problem, hearing loss and tinnitus.        Overdue for dentist  Eyes: Negative for visual disturbance.       No diplopia or  unilateral vision loss  Respiratory: Negative for cough, chest tightness and shortness of breath.   Cardiovascular: Negative for chest pain, palpitations and leg swelling.  Gastrointestinal: Negative for nausea, vomiting, abdominal pain, constipation and blood in stool.       Blood only paper from hemorrhoid--never in stool  Endocrine: Negative for polydipsia and polyuria.  Genitourinary: Negative for dysuria, hematuria and difficulty urinating.  Musculoskeletal: Negative for back pain, joint swelling and arthralgias.  Skin: Negative for rash.  Allergic/Immunologic: Negative for environmental allergies and immunocompromised state.  Neurological: Positive for numbness. Negative for dizziness, syncope, weakness, light-headedness and headaches.       Some AM hand numbness--- resolves quickly  Hematological: Negative for adenopathy. Does not bruise/bleed easily.  Psychiatric/Behavioral: Positive for sleep disturbance. Negative for dysphoric mood. The patient is not nervous/anxious.        Some trouble initiating sleep--tylenol PM helps at times       Objective:   Physical Exam  Constitutional: She is oriented to person, place, and time. She appears well-developed and well-nourished. No distress.  HENT:  Head: Normocephalic and atraumatic.  Right Ear: External ear normal.  Left Ear: External ear normal.  Mouth/Throat: Oropharynx is clear and moist. No oropharyngeal exudate.  Eyes: Conjunctivae and EOM are normal. Pupils are equal, round, and reactive to light.  Neck: Normal range of motion. Neck supple. No thyromegaly present.  Cardiovascular: Normal rate, regular rhythm, normal heart sounds and intact distal pulses.   No murmur heard. Pulmonary/Chest: Effort normal and breath sounds normal. No respiratory distress. She has no wheezes. She has no rales.  Abdominal: Soft. There is no tenderness.  Musculoskeletal: She exhibits no edema or tenderness.  Lymphadenopathy:    She has no  cervical adenopathy.  Neurological: She is alert and oriented to person, place, and time.  Skin: No rash noted. No erythema.  Psychiatric: She has a normal mood and affect. Her behavior is normal.          Assessment & Plan:

## 2014-10-15 NOTE — Progress Notes (Signed)
Pre visit review using our clinic review tool, if applicable. No additional management support is needed unless otherwise documented below in the visit note. 

## 2014-10-15 NOTE — Assessment & Plan Note (Signed)
Healthy Recent mammogram--prefers no breast exam after we discussed She preferred no Pap this year. Discussed this---she will probably accept pap next year but no pelvic exam (that is the bad part for her). Will set up colonoscopy if she has repeated hemorrhoidal bleeding or any blood in stool

## 2014-10-15 NOTE — Patient Instructions (Signed)
You can try melatonin to help sleep. I recommend starting at 3-5mg  and you can go up as high as 10mg  at bedtime.

## 2015-01-14 ENCOUNTER — Encounter: Payer: Self-pay | Admitting: Internal Medicine

## 2015-01-14 ENCOUNTER — Ambulatory Visit (INDEPENDENT_AMBULATORY_CARE_PROVIDER_SITE_OTHER): Payer: PRIVATE HEALTH INSURANCE | Admitting: Internal Medicine

## 2015-01-14 VITALS — BP 120/70 | HR 108 | Temp 97.8°F | Wt 154.0 lb

## 2015-01-14 DIAGNOSIS — G501 Atypical facial pain: Secondary | ICD-10-CM | POA: Insufficient documentation

## 2015-01-14 MED ORDER — CARBAMAZEPINE 100 MG PO CHEW: 100.0000 mg | CHEWABLE_TABLET | Freq: Two times a day (BID) | ORAL | Status: DC

## 2015-01-14 NOTE — Progress Notes (Signed)
Pre visit review using our clinic review tool, if applicable. No additional management support is needed unless otherwise documented below in the visit note. 

## 2015-01-14 NOTE — Progress Notes (Signed)
   Subjective:    Patient ID: Erin Fuentes, female    DOB: 1970-11-17, 44 y.o.   MRN: [id number]  HPI Here due to headache problems  3 weeks ago , she got pain in left occipital area Tried heat and ice with minimal relief Now the pain is in front of the left ear and towards the eye and forehead Pressure sensation --not throbbing  The headache then generalizes to right side but still mostly in left forehead  Feels "something going on" in left ear Some nausea Does have some photosensitivity  No worsening with movement  Tried some ASA--- helped some today  No current outpatient prescriptions on file prior to visit.   No current facility-administered medications on file prior to visit.    Allergies  Allergen Reactions  . Erythromycin Base Diarrhea    Past Medical History  Diagnosis Date  . MVP (mitral valve prolapse)   . Vitamin B12 deficiency     oral therapy effective    Past Surgical History  Procedure Laterality Date  . Femoral hernia repair  ~1978    left ?  . Cesarean section  1993, 1996    x 2  . Appendectomy  2011    Family History  Problem Relation Age of Onset  . Diabetes Paternal Uncle   . Heart disease Maternal Grandmother     died of sudden MI  . Cancer Neg Hx   . Diabetes Other     very prevalent    History   Social History  . Marital Status: Single    Spouse Name: N/A  . Number of Children: 2  . Years of Education: N/A   Occupational History  . 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 Smoking status: Never Smoker   . Smokeless tobacco: Never Used  . Alcohol Use: No  . Drug Use: No  . Sexual Activity: Not on file   Other Topics Concern  . Not on file   Social History Narrative   Review of Systems No vision loss No pain with chewing Has avoided driving due to the pain No focal weakness. No aphasia Has noted a little more forgetfulness lately    Objective:   Physical Exam    Constitutional: She is oriented to person, place, and time. She appears well-developed and well-nourished. No distress.  HENT:  Mouth/Throat: Oropharynx is clear and moist. No oropharyngeal exudate.  TMs normal No TMJ tenderness No localized tenderness in pre- postauricular areas on left  Eyes: Conjunctivae and EOM are normal. Pupils are equal, round, and reactive to light.  Neck: Normal range of motion. Neck supple. No thyromegaly present.  Lymphadenopathy:    She has no cervical adenopathy.  Neurological: She is alert and oriented to person, place, and time. She has normal strength. She displays no tremor. No cranial nerve deficit. She exhibits normal muscle tone. Coordination and gait normal.  Psychiatric: She has a normal mood and affect. Her behavior is normal.          Assessment & Plan:

## 2015-01-14 NOTE — Assessment & Plan Note (Signed)
Could be trigeminal neuralgia in V1 distribution but more chronic and not paroxysmal and that location is not typical Not migrainous Not muscle tension No features that otherwise suggest intracranial pathology  Will try low dose carbamezapine and refer to neurology No imaging for now but may need MRI  Don't think blood work is needed

## 2015-02-18 ENCOUNTER — Ambulatory Visit: Payer: PRIVATE HEALTH INSURANCE | Admitting: Neurology

## 2015-03-04 ENCOUNTER — Telehealth: Payer: Self-pay | Admitting: Internal Medicine

## 2015-03-04 ENCOUNTER — Encounter: Payer: Self-pay | Admitting: Internal Medicine

## 2015-03-04 ENCOUNTER — Ambulatory Visit (INDEPENDENT_AMBULATORY_CARE_PROVIDER_SITE_OTHER): Payer: PRIVATE HEALTH INSURANCE | Admitting: Internal Medicine

## 2015-03-04 VITALS — BP 106/82 | HR 98 | Temp 98.4°F | Wt 154.0 lb

## 2015-03-04 DIAGNOSIS — W57XXXA Bitten or stung by nonvenomous insect and other nonvenomous arthropods, initial encounter: Secondary | ICD-10-CM | POA: Diagnosis not present

## 2015-03-04 DIAGNOSIS — S70361A Insect bite (nonvenomous), right thigh, initial encounter: Secondary | ICD-10-CM

## 2015-03-04 NOTE — Progress Notes (Signed)
Pre visit review using our clinic review tool, if applicable. No additional management support is needed unless otherwise documented below in the visit note. 

## 2015-03-04 NOTE — Telephone Encounter (Signed)
Patient Name: Erin Fuentes  DOB: [birth year]    Initial Comment Caller states pulled off a tick this am, concerned the head is still in there, the skin is raised up, wants to know what to do    Nurse Assessment  Nurse: Mallie Mussel, RN, Alveta Heimlich Date/Time Eilene Ghazi Time): 03/04/2015 9:11:53 AM  Confirm and document reason for call. If symptomatic, describe symptoms. ---Caller states that she pulled a tick off of her right hip this morning. She is not sure she was able to get the head, the skin area is raised a little. Denies fever and rashes. She has had a headache which began yesterday. She has taken 2 ASA which has helped.  Has the patient traveled out of the country within the last 30 days? ---No  Does the patient require triage? ---Yes  Related visit to physician within the last 2 weeks? ---No  Does the PT have any chronic conditions? (i.e. diabetes, asthma, etc.) ---No  Did the patient indicate they were pregnant? ---No     Guidelines    Guideline Title Affirmed Question Affirmed Notes  Tick Bite [1] 2 to 14 days following tick bite AND [2] severe headache with fever occurs The tick is swollen, she does have a headache and feels warm, does not have a thermometer.   Final Disposition User   Go to ED Now (or PCP triage) Mallie Mussel, RN, Alveta Heimlich    Comments  Appointment scheduled for 11:30am with Webb Silversmith.

## 2015-03-04 NOTE — Progress Notes (Signed)
Subjective:    Patient ID: Erin Fuentes, female    DOB: Sep 21, 1971, 44 y.o.   MRN: [id number]  HPI  Pt presents to the clinic today with c/o a tick bite. She noticed it this morning. She was able to take it off this morning. She was not able to get the tick out completely. The tick was not engorged and she does not think it was on her that long. She denies fever, chills, rash, or body aches. She has not put anything on it OTC.  Review of Systems      Past Medical History  Diagnosis Date  . MVP (mitral valve prolapse)   . Vitamin B12 deficiency     oral therapy effective    Current Outpatient Prescriptions  Medication Sig Dispense Refill  . carbamazepine (TEGRETOL) 100 MG chewable tablet Chew 1 tablet (100 mg total) by mouth 2 (two) times daily. 60 tablet 1   No current facility-administered medications for this visit.    Allergies  Allergen Reactions  . Erythromycin Base Diarrhea    Family History  Problem Relation Age of Onset  . Diabetes Paternal Uncle   . Heart disease Maternal Grandmother     died of sudden MI  . Cancer Neg Hx   . Diabetes Other     very prevalent    History   Social History  . Marital Status: Single    Spouse Name: N/A  . Number of Children: 2  . Years of Education: N/A   Occupational History  . 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 Smoking status: Never Smoker   . Smokeless tobacco: Never Used  . Alcohol Use: No  . Drug Use: No  . Sexual Activity: Not on file   Other Topics Concern  . Not on file   Social History Narrative     Constitutional: Denies fever, malaise, fatigue, headache or abrupt weight changes.  Respiratory: Denies difficulty breathing, shortness of breath, cough or sputum production.   Cardiovascular: Denies chest pain, chest tightness, palpitations or swelling in the hands or feet.  Musculoskeletal: Denies decrease in range of motion, difficulty with gait,  muscle pain or joint pain and swelling.  Skin: Pt reports tick bite to right thigh. Denies rashes or ulcercations.  Neurological: Denies dizziness, difficulty with memory, difficulty with speech or problems with balance and coordination.   No other specific complaints in a complete review of systems (except as listed in HPI above).  Objective:   Physical Exam   BP 106/82 mmHg  Pulse 98  Temp(Src) 98.4 F (36.9 C) (Oral)  Wt 154 lb (69.854 kg)  SpO2 99%  LMP 02/11/2015 Wt Readings from Last 3 Encounters:  03/04/15 154 lb (69.854 kg)  01/14/15 154 lb (69.854 kg)  10/15/14 156 lb (70.761 kg)    General: Appears her stated age, well developed, well nourished in NAD. Skin: Small lesion to right thigh. Tick head embedded in the skin. No surrounding cellulitis noted. Cardiovascular: Normal rate and rhythm. S1,S2 noted.  No murmur, rubs or gallops noted. d. Pulmonary/Chest: Normal effort and positive vesicular breath sounds. No respiratory distress. No wheezes, rales or ronchi noted.   BMET    Component Value Date/Time   NA 138 08/09/2010 1259   K 4.8 08/09/2010 1259   CL 105 08/09/2010 1259   CO2 26 08/09/2010 1259   GLUCOSE 68* 09/19/2013 1223   BUN 10 08/09/2010 1259   CREATININE 0.8  08/09/2010 1259   CALCIUM 9.3 08/09/2010 1259   GFRNONAA 85.46 08/09/2010 1259    Lipid Panel     Component Value Date/Time   CHOL 192 09/19/2013 1223   TRIG 68.0 09/19/2013 1223   HDL 63.00 09/19/2013 1223   CHOLHDL 3 09/19/2013 1223   VLDL 13.6 09/19/2013 1223   LDLCALC 115* 09/19/2013 1223    CBC    Component Value Date/Time   WBC 6.6 09/19/2013 1223   RBC 4.19 09/19/2013 1223   HGB 13.1 09/19/2013 1223   HCT 38.5 09/19/2013 1223   PLT 342.0 09/19/2013 1223   MCV 92.0 09/19/2013 1223   MCHC 34.0 09/19/2013 1223   RDW 12.3 09/19/2013 1223   LYMPHSABS 2.6 09/19/2013 1223   MONOABS 0.5 09/19/2013 1223   EOSABS 0.2 09/19/2013 1223   BASOSABS 0.0 09/19/2013 1223    Hgb  A1C No results found for: HGBA1C      Assessment & Plan:   Tick in right thigh:  Head removed using 18 g needle Advised pt to use Neosporin OTC as needed Watch for increasing redness, rash or fever  RTC as needed or if symptoms persist or worsen

## 2015-03-04 NOTE — Patient Instructions (Signed)
Tick Bite Information Ticks are insects that attach themselves to the skin and draw blood for food. There are various types of ticks. Common types include wood ticks and deer ticks. Most ticks live in shrubs and grassy areas. Ticks can climb onto your body when you make contact with leaves or grass where the tick is waiting. The most common places on the body for ticks to attach themselves are the scalp, neck, armpits, waist, and groin. Most tick bites are harmless, but sometimes ticks carry germs that cause diseases. These germs can be spread to a person during the tick's feeding process. The chance of a disease spreading through a tick bite depends on:   The type of tick.  Time of year.   How long the tick is attached.   Geographic location.  HOW CAN YOU PREVENT TICK BITES? Take these steps to help prevent tick bites when you are outdoors:  Wear protective clothing. Long sleeves and long pants are best.   Wear white clothes so you can see ticks more easily.  Tuck your pant legs into your socks.   If walking on a trail, stay in the middle of the trail to avoid brushing against bushes.  Avoid walking through areas with long grass.  Put insect repellent on all exposed skin and along boot tops, pant legs, and sleeve cuffs.   Check clothing, hair, and skin repeatedly and before going inside.   Brush off any ticks that are not attached.  Take a shower or bath as soon as possible after being outdoors.  WHAT IS THE PROPER WAY TO REMOVE A TICK? Ticks should be removed as soon as possible to help prevent diseases caused by tick bites. 1. If latex gloves are available, put them on before trying to remove a tick.  2. Using fine-point tweezers, grasp the tick as close to the skin as possible. You may also use curved forceps or a tick removal tool. Grasp the tick as close to its head as possible. Avoid grasping the tick on its body. 3. Pull gently with steady upward pressure until  the tick lets go. Do not twist the tick or jerk it suddenly. This may break off the tick's head or mouth parts. 4. Do not squeeze or crush the tick's body. This could force disease-carrying fluids from the tick into your body.  5. After the tick is removed, wash the bite area and your hands with soap and water or other disinfectant such as alcohol. 6. Apply a small amount of antiseptic cream or ointment to the bite site.  7. Wash and disinfect any instruments that were used.  Do not try to remove a tick by applying a hot match, petroleum jelly, or fingernail polish to the tick. These methods do not work and may increase the chances of disease being spread from the tick bite.  WHEN SHOULD YOU SEEK MEDICAL CARE? Contact your health care provider if you are unable to remove a tick from your skin or if a part of the tick breaks off and is stuck in the skin.  After a tick bite, you need to be aware of signs and symptoms that could be related to diseases spread by ticks. Contact your health care provider if you develop any of the following in the days or weeks after the tick bite:  Unexplained fever.  Rash. A circular rash that appears days or weeks after the tick bite may indicate the possibility of Lyme disease. The rash may resemble   a target with a bull's-eye and may occur at a different part of your body than the tick bite.  Redness and swelling in the area of the tick bite.   Tender, swollen lymph glands.   Diarrhea.   Weight loss.   Cough.   Fatigue.   Muscle, joint, or bone pain.   Abdominal pain.   Headache.   Lethargy or a change in your level of consciousness.  Difficulty walking or moving your legs.   Numbness in the legs.   Paralysis.  Shortness of breath.   Confusion.   Repeated vomiting.  Document Released: 09/22/2000 Document Revised: 07/16/2013 Document Reviewed: 03/05/2013 Exit[employer] Patient Information 2015 [employer]. This information is  not intended to replace advice given to you by your health care provider. Make sure you discuss any questions you have with your health care provider.  

## 2015-03-04 NOTE — Telephone Encounter (Signed)
Will await that evaluation

## 2015-03-09 ENCOUNTER — Telehealth: Payer: Self-pay | Admitting: *Deleted

## 2015-03-09 ENCOUNTER — Telehealth: Payer: Self-pay | Admitting: Internal Medicine

## 2015-03-09 NOTE — Telephone Encounter (Signed)
Danae Chen from [facility] called to inform you that Ms. Erin Fuentes cancelled her neurology appt with Dr. Tomi Likens form tomorrow. This is her 2nd cancellation with Dr. Tomi Likens.

## 2015-03-09 NOTE — Telephone Encounter (Signed)
Find out if her facial pain is better

## 2015-03-09 NOTE — Telephone Encounter (Signed)
Patient canceled her New patient appointment for 6/1 referring office notified Levada Dy)

## 2015-03-09 NOTE — Telephone Encounter (Signed)
Left message on VM asking if facial pain was better and to give [place] a call back.

## 2015-03-10 ENCOUNTER — Ambulatory Visit: Payer: PRIVATE HEALTH INSURANCE | Admitting: Neurology

## 2015-03-30 ENCOUNTER — Other Ambulatory Visit: Payer: Self-pay | Admitting: Internal Medicine

## 2015-07-14 ENCOUNTER — Ambulatory Visit: Payer: PRIVATE HEALTH INSURANCE | Admitting: Internal Medicine

## 2015-07-15 ENCOUNTER — Encounter: Payer: Self-pay | Admitting: Internal Medicine

## 2015-07-15 ENCOUNTER — Ambulatory Visit (INDEPENDENT_AMBULATORY_CARE_PROVIDER_SITE_OTHER): Payer: PRIVATE HEALTH INSURANCE | Admitting: Internal Medicine

## 2015-07-15 VITALS — BP 128/78 | HR 89 | Temp 98.1°F | Wt 159.0 lb

## 2015-07-15 DIAGNOSIS — R350 Frequency of micturition: Secondary | ICD-10-CM

## 2015-07-15 DIAGNOSIS — R102 Pelvic and perineal pain: Secondary | ICD-10-CM | POA: Diagnosis not present

## 2015-07-15 LAB — POCT URINALYSIS DIPSTICK
Bilirubin, UA: NEGATIVE
Glucose, UA: NEGATIVE
KETONES UA: NEGATIVE
Leukocytes, UA: NEGATIVE
Nitrite, UA: NEGATIVE
PH UA: 7
PROTEIN UA: NEGATIVE
RBC UA: NEGATIVE
SPEC GRAV UA: 1.02
Urobilinogen, UA: NEGATIVE

## 2015-07-15 NOTE — Progress Notes (Signed)
Subjective:    Patient ID: Erin Fuentes, female    DOB: Mar 08, 1971, 44 y.o.   MRN: [id number]  HPI  Pt presents to the clinic today with c/o lower abdominal cramping. She reports this started 3 weeks ago. It has been intermittent. It seems worse just below her Csection scar. She does reports some increased frequency but denies urgency or dysuria. Her bowels are moving normally and cramping does not improve after BM. She denies blood in her urine or stool. She denies nausea, vomiting or diarrhea. She has been playing softball over the last 4-5 months but has not done anything really strenuous. She has not tried anything OTC. Her LMP was 07/01/15. Her last pap was in 2012.  Review of Systems      Past Medical History  Diagnosis Date  . MVP (mitral valve prolapse)   . Vitamin B12 deficiency     oral therapy effective    Current Outpatient Prescriptions  Medication Sig Dispense Refill  . carbamazepine (TEGRETOL) 100 MG chewable tablet CHEW 1 TABLET (100 MG TOTAL) BY MOUTH 2 (TWO) TIMES DAILY. 60 tablet 1   No current facility-administered medications for this visit.    Allergies  Allergen Reactions  . Erythromycin Base Diarrhea    Family History  Problem Relation Age of Onset  . Diabetes Paternal Uncle   . Heart disease Maternal Grandmother     died of sudden MI  . Cancer Neg Hx   . Diabetes Other     very prevalent    Social History   Social History  . Marital Status: Single    Spouse Name: N/A  . Number of Children: 2  . Years of Education: N/A   Occupational History  . 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 Smokeless tobacco: Never Used  . Alcohol Use: No  . Drug Use: No  . Sexual Activity: Not on file   Other Topics Concern  . Not on file   Social History Narrative     Constitutional: Denies fever, malaise, fatigue, headache or abrupt weight changes.  Respiratory:  Denies difficulty breathing, shortness of breath, cough or sputum production.   Cardiovascular: Denies chest pain, chest tightness, palpitations or swelling in the hands or feet.  Gastrointestinal: Pt reports abdominal pain. Denies bloating, constipation, diarrhea or blood in the stool.  GU: Pt reports frequency. Denies urgency, pain with urination, burning sensation, blood in urine, odor or discharge.  No other specific complaints in a complete review of systems (except as listed in HPI above).  Objective:   Physical Exam   BP 128/78 mmHg  Pulse 89  Temp(Src) 98.1 F (36.7 C) (Oral)  Wt 159 lb (72.122 kg)  SpO2 98%  LMP 07/01/2015 Wt Readings from Last 3 Encounters:  07/15/15 159 lb (72.122 kg)  03/04/15 154 lb (69.854 kg)  01/14/15 154 lb (69.854 kg)    General: Appears their stated age, well developed, well nourished in NAD. Skin: Warm, dry and intact. No rashes, lesions or ulcerations noted. HEENT: Head: normal shape and size; Eyes: sclera white, no icterus, conjunctiva pink, PERRLA and EOMs intact; Ears: Tm's gray and intact, normal light reflex; Nose: mucosa pink and moist, septum midline; Throat/Mouth: Teeth present, mucosa pink and moist, no exudate, lesions or ulcerations noted.  Neck:  Neck supple, trachea midline. No masses, lumps or thyromegaly present.  Cardiovascular: Normal rate and rhythm. S1,S2 noted.  No murmur, rubs or gallops noted. No JVD or BLE edema. No carotid bruits noted. Pulmonary/Chest: Normal effort and positive vesicular breath sounds. No respiratory distress. No wheezes, rales or ronchi noted.  Abdomen: Soft and nontender. Normal bowel sounds, no bruits noted. No distention or masses noted. Liver, spleen and kidneys non palpable. Musculoskeletal: Normal range of motion. No signs of joint swelling. No difficulty with gait.  Neurological: Alert and oriented. Cranial nerves II-XII grossly  intact. Coordination normal.  Psychiatric: Mood and affect normal.  Behavior is normal. Judgment and thought content normal.   EKG:  BMET    Component Value Date/Time   NA 138 08/09/2010 1259   K 4.8 08/09/2010 1259   CL 105 08/09/2010 1259   CO2 26 08/09/2010 1259   GLUCOSE 68* 09/19/2013 1223   BUN 10 08/09/2010 1259   CREATININE 0.8 08/09/2010 1259   CALCIUM 9.3 08/09/2010 1259   GFRNONAA 85.46 08/09/2010 1259    Lipid Panel     Component Value Date/Time   CHOL 192 09/19/2013 1223   TRIG 68.0 09/19/2013 1223   HDL 63.00 09/19/2013 1223   CHOLHDL 3 09/19/2013 1223   VLDL 13.6 09/19/2013 1223   LDLCALC 115* 09/19/2013 1223    CBC    Component Value Date/Time   WBC 6.6 09/19/2013 1223   RBC 4.19 09/19/2013 1223   HGB 13.1 09/19/2013 1223   HCT 38.5 09/19/2013 1223   PLT 342.0 09/19/2013 1223   MCV 92.0 09/19/2013 1223   MCHC 34.0 09/19/2013 1223   RDW 12.3 09/19/2013 1223   LYMPHSABS 2.6 09/19/2013 1223   MONOABS 0.5 09/19/2013 1223   EOSABS 0.2 09/19/2013 1223   BASOSABS 0.0 09/19/2013 1223    Hgb A1C No results found for: HGBA1C      Assessment & Plan:   Pelvic pain in female with urinary frequency:  ? Adhesion from scar tissue Offered to do pelvic exam today with pap, but she declines Urinalysis: normal Offered to order ultrasound of pelvis/transvaginal to assess female organs but I want pap done first She would like to monitor her symptoms for now and she will pursue workup if symptoms persist or worsen  RTC as needed or if symptoms persist or worsen

## 2015-07-15 NOTE — Progress Notes (Signed)
Pre visit review using our clinic review tool, if applicable. No additional management support is needed unless otherwise documented below in the visit note. 

## 2015-07-15 NOTE — Addendum Note (Signed)
Addended by: Lurlean Nanny on: 07/15/2015 01:49 PM   Modules accepted: Orders

## 2015-07-15 NOTE — Patient Instructions (Signed)

## 2015-11-04 ENCOUNTER — Ambulatory Visit (INDEPENDENT_AMBULATORY_CARE_PROVIDER_SITE_OTHER): Payer: PRIVATE HEALTH INSURANCE | Admitting: Internal Medicine

## 2015-11-04 ENCOUNTER — Encounter: Payer: Self-pay | Admitting: Internal Medicine

## 2015-11-04 ENCOUNTER — Other Ambulatory Visit (HOSPITAL_COMMUNITY)
Admission: RE | Admit: 2015-11-04 | Discharge: 2015-11-04 | Disposition: A | Discharge status: Home / Self Care | Payer: 59 | Source: Ambulatory Visit | Attending: Internal Medicine | Admitting: Internal Medicine

## 2015-11-04 VITALS — BP 110/68 | HR 77 | Temp 97.7°F | Ht 65.0 in | Wt 158.0 lb

## 2015-11-04 DIAGNOSIS — E538 Deficiency of other specified B group vitamins: Secondary | ICD-10-CM | POA: Diagnosis not present

## 2015-11-04 DIAGNOSIS — Z1151 Encounter for screening for human papillomavirus (HPV): Secondary | ICD-10-CM | POA: Diagnosis not present

## 2015-11-04 DIAGNOSIS — N959 Unspecified menopausal and perimenopausal disorder: Secondary | ICD-10-CM | POA: Diagnosis not present

## 2015-11-04 DIAGNOSIS — Z0000 Encounter for general adult medical examination without abnormal findings: Secondary | ICD-10-CM

## 2015-11-04 DIAGNOSIS — G501 Atypical facial pain: Secondary | ICD-10-CM

## 2015-11-04 DIAGNOSIS — Z01419 Encounter for gynecological examination (general) (routine) without abnormal findings: Secondary | ICD-10-CM | POA: Insufficient documentation

## 2015-11-04 NOTE — Progress Notes (Signed)
Pre visit review using our clinic review tool, if applicable. No additional management support is needed unless otherwise documented below in the visit note. 

## 2015-11-04 NOTE — Assessment & Plan Note (Signed)
Has slacked off on supplements Will recheck levels

## 2015-11-04 NOTE — Progress Notes (Signed)
Subjective:    Patient ID: Erin Fuentes, female    DOB: Jul 24, 1971, 45 y.o.   MRN: [id number]  HPI Here for physical  Doing well No longer having pelvic pain Periods still fairly regular--- is having nausea before cycles, and having headaches and thigh aching when on her cycle. Bleeds for 6-7 days Sits in recliner with heating pad on legs--helps. Aleve not much help Some sweats at night No intercourse still  Still gets some facial pain Prn carbamazepine does help this (and will help her headaches as well) May take ~4 doses a week No breast masses or pain  Current Outpatient Prescriptions on File Prior to Visit  Medication Sig Dispense Refill  . carbamazepine (TEGRETOL) 100 MG chewable tablet CHEW 1 TABLET (100 MG TOTAL) BY MOUTH 2 (TWO) TIMES DAILY. 60 tablet 1   No current facility-administered medications on file prior to visit.    Allergies  Allergen Reactions  . Erythromycin Base Diarrhea    Past Medical History  Diagnosis Date  . MVP (mitral valve prolapse)   . Vitamin B12 deficiency     oral therapy effective    Past Surgical History  Procedure Laterality Date  . Femoral hernia repair  ~1978    left ?  . Cesarean section  1993, 1996    x 2  . Appendectomy  2011    Family History  Problem Relation Age of Onset  . Diabetes Paternal Uncle   . Heart disease Maternal Grandmother     died of sudden MI  . Cancer Neg Hx   . Diabetes Other     very prevalent    Social History   Social History  . Marital Status: Single    Spouse Name: N/A  . Number of Children: 2  . Years of Education: N/A   Occupational History  . 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 Smokeless tobacco: Never Used  . Alcohol Use: No  . Drug Use: No  . Sexual Activity: Not on file   Other Topics Concern  . Not on file   Social History Narrative   Review of Systems  Constitutional: Negative  for fatigue and unexpected weight change.       Wears seat belt Played softball--tries to walk in winter  HENT: Negative for dental problem, hearing loss and tinnitus.        Keeps up with dentist  Eyes: Negative for visual disturbance.       No diplopia or unilateral vision loss  Respiratory: Negative for cough, chest tightness and shortness of breath.   Cardiovascular: Negative for chest pain, palpitations and leg swelling.  Gastrointestinal: Positive for nausea. Negative for vomiting, abdominal pain and blood in stool.       No heartburn   Endocrine: Negative for polyuria.  Genitourinary: Negative for dysuria, hematuria and difficulty urinating.  Musculoskeletal: Negative for back pain, joint swelling and arthralgias.  Skin: Negative for rash.       No suspicious lesions  Allergic/Immunologic: Positive for environmental allergies. Negative for immunocompromised state.       Uses OTC at times  Neurological: Positive for headaches. Negative for dizziness, syncope, weakness and light-headedness.  Hematological: Negative for adenopathy. Does not bruise/bleed easily.  Psychiatric/Behavioral: Negative for dysphoric mood. The patient is not nervous/anxious.        Occ trouble initiating sleep--then does okay  Objective:   Physical Exam  Constitutional: She is oriented to person, place, and time. She appears well-developed and well-nourished. No distress.  HENT:  Head: Normocephalic and atraumatic.  Right Ear: External ear normal.  Left Ear: External ear normal.  Mouth/Throat: Oropharynx is clear and moist. No oropharyngeal exudate.  Eyes: Conjunctivae are normal. Pupils are equal, round, and reactive to light.  Neck: Normal range of motion. Neck supple. No thyromegaly present.  Cardiovascular: Normal rate, regular rhythm, normal heart sounds and intact distal pulses.  Exam reveals no gallop.   No murmur heard. Pulmonary/Chest: Effort normal and breath sounds normal. No  respiratory distress. She has no wheezes. She has no rales.  Abdominal: Soft. There is no tenderness.  Genitourinary:  Small introitus Cervix appears normal Pap done Bimanual deferred  Musculoskeletal: She exhibits no edema or tenderness.  Lymphadenopathy:    She has no cervical adenopathy.  Neurological: She is alert and oriented to person, place, and time.  Skin: No rash noted. No erythema.  Psychiatric: She has a normal mood and affect. Her behavior is normal.          Assessment & Plan:

## 2015-11-04 NOTE — Assessment & Plan Note (Signed)
Actually does well with just prn carbamazepine

## 2015-11-04 NOTE — Assessment & Plan Note (Signed)
Generally healthy Did allow pap She will get mammo again later this year Prefers no flu vaccine

## 2015-11-04 NOTE — Addendum Note (Signed)
Addended by: Despina Hidden on: 11/04/2015 03:55 PM   Modules accepted: Orders

## 2015-11-04 NOTE — Assessment & Plan Note (Signed)
Headache, nausea, etc She will continue symptomatic Rx If worsens, would try low dose OCP

## 2015-11-05 LAB — CBC WITH DIFFERENTIAL/PLATELET
Basophils Absolute: 0 10*3/uL (ref 0.0–0.1)
Basophils Relative: 0.6 % (ref 0.0–3.0)
Eosinophils Absolute: 0.2 10*3/uL (ref 0.0–0.7)
Eosinophils Relative: 2.1 % (ref 0.0–5.0)
HCT: 39.6 % (ref 36.0–46.0)
HEMOGLOBIN: 13.1 g/dL (ref 12.0–15.0)
Lymphocytes Relative: 31.6 % (ref 12.0–46.0)
Lymphs Abs: 2.4 10*3/uL (ref 0.7–4.0)
MCHC: 33 g/dL (ref 30.0–36.0)
MCV: 92.8 fl (ref 78.0–100.0)
Monocytes Absolute: 0.5 10*3/uL (ref 0.1–1.0)
Monocytes Relative: 7.3 % (ref 3.0–12.0)
Neutro Abs: 4.4 10*3/uL (ref 1.4–7.7)
Neutrophils Relative %: 58.4 % (ref 43.0–77.0)
Platelets: 336 10*3/uL (ref 150.0–400.0)
RBC: 4.27 Mil/uL (ref 3.87–5.11)
RDW: 12.5 % (ref 11.5–15.5)
WBC: 7.5 10*3/uL (ref 4.0–10.5)

## 2015-11-05 LAB — COMPREHENSIVE METABOLIC PANEL
ALBUMIN: 4.4 g/dL (ref 3.5–5.2)
ALK PHOS: 61 U/L (ref 39–117)
ALT: 14 U/L (ref 0–35)
AST: 21 U/L (ref 0–37)
BUN: 14 mg/dL (ref 6–23)
CO2: 29 mEq/L (ref 19–32)
Calcium: 9.2 mg/dL (ref 8.4–10.5)
Chloride: 102 mEq/L (ref 96–112)
Creatinine, Ser: 0.93 mg/dL (ref 0.40–1.20)
GFR: 69.07 mL/min (ref >60.00)
Glucose, Bld: 75 mg/dL (ref 70–99)
POTASSIUM: 4.1 meq/L (ref 3.5–5.1)
SODIUM: 138 meq/L (ref 135–145)
TOTAL PROTEIN: 7 g/dL (ref 6.0–8.3)
Total Bilirubin: 0.5 mg/dL (ref 0.2–1.2)

## 2015-11-05 LAB — VITAMIN B12: VITAMIN B 12: 228 pg/mL (ref 211–911)

## 2015-11-05 LAB — T4, FREE: FREE T4: 0.79 ng/dL (ref 0.60–1.60)

## 2015-11-08 LAB — CYTOLOGY - PAP

## 2016-01-25 ENCOUNTER — Ambulatory Visit: Payer: PRIVATE HEALTH INSURANCE | Admitting: Internal Medicine

## 2016-02-03 ENCOUNTER — Ambulatory Visit (INDEPENDENT_AMBULATORY_CARE_PROVIDER_SITE_OTHER): Payer: 59 | Admitting: Internal Medicine

## 2016-02-03 ENCOUNTER — Encounter: Payer: Self-pay | Admitting: Internal Medicine

## 2016-02-03 VITALS — BP 104/80 | HR 97 | Temp 98.1°F | Wt 162.0 lb

## 2016-02-03 DIAGNOSIS — M2550 Pain in unspecified joint: Secondary | ICD-10-CM | POA: Insufficient documentation

## 2016-02-03 NOTE — Progress Notes (Signed)
   Subjective:    Patient ID: Erin Fuentes, female    DOB: Jul 17, 1971, 45 y.o.   MRN: [id number]  HPI Here due to joint pain in left knee and elbow Knee is worse Goes back 4-6 weeks Not that bad but wanted to check it out Gets sense of pressure in back of knee----ache in elbow also  Right handed No new tasks in past 2 months Not daily but frequently--but may last all day advil or aspirin--not particularly helpful (like 2 bid) No swelling  Current Outpatient Prescriptions on File Prior to Visit  Medication Sig Dispense Refill  . carbamazepine (TEGRETOL) 100 MG chewable tablet CHEW 1 TABLET (100 MG TOTAL) BY MOUTH 2 (TWO) TIMES DAILY. 60 tablet 1   No current facility-administered medications on file prior to visit.    Allergies  Allergen Reactions  . Erythromycin Base Diarrhea    Past Medical History  Diagnosis Date  . MVP (mitral valve prolapse)   . Vitamin B12 deficiency     oral therapy effective    Past Surgical History  Procedure Laterality Date  . Femoral hernia repair  ~1978    left ?  . Cesarean section  1993, 1996    x 2  . Appendectomy  2011    Family History  Problem Relation Age of Onset  . Diabetes Paternal Uncle   . Heart disease Maternal Grandmother     died of sudden MI  . Cancer Neg Hx   . Diabetes Other     very prevalent    Social History   Social History  . Marital Status: Single    Spouse Name: N/A  . Number of Children: 2  . Years of Education: N/A   Occupational History  . 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 Smoking status: Never Smoker   . Smokeless tobacco: Never Used  . Alcohol Use: No  . Drug Use: No  . Sexual Activity: Not on file   Other Topics Concern  . Not on file   Social History Narrative   Review of Systems No fever No illness prior to onset of symptoms that she can remember Appetite is fine No weight loss No rash No swallowing problems No  photosensitivity     Objective:   Physical Exam  Musculoskeletal:  Left elbow --completely normal. Some clunking with ROM in left shoulder Left wrist normal  Left knee shows no effusion. Normal ROM  MacMurrays/Lachman and other ligament testing negative          Assessment & Plan:

## 2016-02-03 NOTE — Assessment & Plan Note (Signed)
Reassured--no findings Nothing to suggest systemic arthritis Discussed supportive care and muscle strengthening

## 2016-02-03 NOTE — Progress Notes (Signed)
Pre visit review using our clinic review tool, if applicable. No additional management support is needed unless otherwise documented below in the visit note. 

## 2016-02-11 ENCOUNTER — Other Ambulatory Visit: Payer: Self-pay | Admitting: Internal Medicine

## 2016-02-11 DIAGNOSIS — Z1231 Encounter for screening mammogram for malignant neoplasm of breast: Secondary | ICD-10-CM

## 2016-11-08 ENCOUNTER — Encounter: Payer: PRIVATE HEALTH INSURANCE | Admitting: Internal Medicine

## 2017-01-17 ENCOUNTER — Encounter: Payer: PRIVATE HEALTH INSURANCE | Admitting: Internal Medicine

## 2017-02-08 ENCOUNTER — Encounter: Payer: Self-pay | Admitting: Internal Medicine

## 2017-02-08 ENCOUNTER — Ambulatory Visit (INDEPENDENT_AMBULATORY_CARE_PROVIDER_SITE_OTHER): Payer: 59 | Admitting: Internal Medicine

## 2017-02-08 VITALS — BP 108/80 | HR 71 | Temp 98.7°F | Ht 65.25 in | Wt 161.0 lb

## 2017-02-08 DIAGNOSIS — Z0000 Encounter for general adult medical examination without abnormal findings: Secondary | ICD-10-CM

## 2017-02-08 DIAGNOSIS — G501 Atypical facial pain: Secondary | ICD-10-CM | POA: Diagnosis not present

## 2017-02-08 NOTE — Progress Notes (Signed)
Subjective:    Patient ID: Erin Fuentes, female    DOB: 08-Nov-1970, 46 y.o.   MRN: [id number]  HPI Here for physical  Doing well Trying to exercise--treadmill and some free weights  Still has some pain at left elbow and right ankle No significant swelling Will occasionally awaken with finger/hand numbness She hasn't gotten the wrist splints  Periods are better Not lasting as long Some premenstrual pain but not bad--advil helps Still fairly regular  Facial pain is better No longer using the depakote Didn't go to neurologist  No current outpatient prescriptions on file prior to visit.   No current facility-administered medications on file prior to visit.     Allergies  Allergen Reactions  . Erythromycin Base Diarrhea    Past Medical History:  Diagnosis Date  . MVP (mitral valve prolapse)   . Vitamin B12 deficiency    oral therapy effective    Past Surgical History:  Procedure Laterality Date  . APPENDECTOMY  2011  . [place]   x 2  . FEMORAL HERNIA REPAIR  ~1978   left ?    Family History  Problem Relation Age of Onset  . Diabetes Paternal Uncle   . Heart disease Maternal Grandmother     died of sudden MI  . Cancer Neg Hx   . Diabetes Other     very prevalent    Social History   Social History  . Marital status: Single    Spouse name: N/A  . Number of children: 2  . Years of education: N/A   Occupational History  . 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 Smoking status: Never Smoker  . Smokeless tobacco: Never Used  . Alcohol use No  . Drug use: No  . Sexual activity: Not on file   Other Topics Concern  . Not on file   Social History Narrative  . No narrative on file   Review of Systems  Constitutional:       Wears seat belt  HENT: Negative for dental problem and tinnitus.        Wax in right ear---hearing is usually okay  Eyes: Negative for visual disturbance.         No diplopia or unilateral vision loss  Respiratory: Negative for cough, chest tightness and shortness of breath.   Cardiovascular: Positive for palpitations. Negative for chest pain.       Some trouble with vein swelling in legs--discussed support hose occ palpitations if a little anxious  Gastrointestinal: Negative for blood in stool, constipation, nausea and vomiting.       No heartburn   Endocrine: Negative for polydipsia and polyuria.  Genitourinary: Negative for dyspareunia, dysuria and hematuria.       Remains abstinent  Musculoskeletal: Positive for arthralgias. Negative for back pain and joint swelling.  Skin: Negative for rash.       No suspicious lesions  Allergic/Immunologic: Positive for environmental allergies. Negative for immunocompromised state.       Zyrtec and occ benedryl at night  Neurological: Positive for headaches. Negative for dizziness, syncope and light-headedness.  Hematological: Negative for adenopathy. Bruises/bleeds easily.  Psychiatric/Behavioral: Negative for dysphoric mood. The patient is not nervous/anxious.        Occasional trouble initiating sleep       Objective:   Physical Exam  Constitutional: She is oriented to person, place, and time. She appears well-developed and well-nourished. No  distress.  HENT:  Head: Normocephalic and atraumatic.  Right Ear: External ear normal.  Left Ear: External ear normal.  Mouth/Throat: Oropharynx is clear and moist. No oropharyngeal exudate.  Eyes: Conjunctivae are normal. Pupils are equal, round, and reactive to light.  Neck: Normal range of motion. Neck supple. No thyromegaly present.  Cardiovascular: Normal rate, regular rhythm, normal heart sounds and intact distal pulses.  Exam reveals no gallop.   No murmur heard. Pulmonary/Chest: Effort normal and breath sounds normal. No respiratory distress. She has no wheezes. She has no rales.  Abdominal: Soft. There is no tenderness.  Musculoskeletal: She  exhibits no edema or tenderness.  Lymphadenopathy:    She has no cervical adenopathy.  Neurological: She is alert and oriented to person, place, and time.  Skin: No rash noted. No erythema.  Psychiatric: She has a normal mood and affect. Her behavior is normal.          Assessment & Plan:

## 2017-02-08 NOTE — Progress Notes (Signed)
Pre visit review using our clinic review tool, if applicable. No additional management support is needed unless otherwise documented below in the visit note. 

## 2017-02-08 NOTE — Assessment & Plan Note (Signed)
Better Off the depakote for now

## 2017-02-08 NOTE — Assessment & Plan Note (Signed)
Fairly healthy Discussed fitness Pap due 2022 She will think about mammogram---I recommended for sure at age 46

## 2017-05-27 ENCOUNTER — Emergency Department
Admission: EM | Admit: 2017-05-27 | Discharge: 2017-05-27 | Disposition: A | Discharge status: Home / Self Care | Payer: 59 | Attending: Emergency Medicine | Admitting: Emergency Medicine

## 2017-05-27 DIAGNOSIS — Z79899 Other long term (current) drug therapy: Secondary | ICD-10-CM | POA: Diagnosis not present

## 2017-05-27 DIAGNOSIS — H6091 Unspecified otitis externa, right ear: Secondary | ICD-10-CM

## 2017-05-27 DIAGNOSIS — H9201 Otalgia, right ear: Secondary | ICD-10-CM | POA: Diagnosis present

## 2017-05-27 MED ORDER — CIPROFLOXACIN-DEXAMETHASONE 0.3-0.1 % OT SUSP: 4.0000 [drp] | Freq: Two times a day (BID) | OTIC | 0 refills | Status: AC

## 2017-05-27 NOTE — ED Triage Notes (Signed)
Right ear fullness and pain. Pt given amoxicillin prescription yesterday, took one dose this AM. Pt alert and oriented X4, active, cooperative, pt in NAD. RR even and unlabored, color WNL.

## 2017-05-27 NOTE — Discharge Instructions (Signed)
Take medication as prescribed. Return to emergency department if symptoms worsen and follow-up with PCP as needed.   °

## 2017-05-27 NOTE — ED Provider Notes (Signed)
[facility] Emergency Department Provider Note   ____________________________________________   I have reviewed the triage vital signs and the nursing notes.   HISTORY  Chief Complaint Ear Fullness    HPI Erin Fuentes is a 46 y.o. female presents to the emergency department with right ear fullness that has progressed despite taking amoxicillin prescription she was prescribed yesterday after [facility]. Patient reports right ear fullness, tragus pain radiating down the jawline and with palpation of the external ear. Patient denies any drainage from the ear. Patient hasn't recently swam horse emerged her ears underwater. Patient denies fever, chills, headache, vision changes, chest pain, chest tightness, shortness of breath, abdominal pain, nausea and vomiting.  Past Medical History:  Diagnosis Date  . MVP (mitral valve prolapse)   . Vitamin B12 deficiency    oral therapy effective    Patient Active Problem List   Diagnosis Date Noted  . Arthralgia 02/03/2016  . Perimenopausal disorder 11/04/2015  . Facial pain, atypical 01/14/2015  . Routine general medical examination at a health care facility 02/29/2012  . Vitamin B12 deficiency   . MITRAL VALVE PROLAPSE 08/09/2010    Past Surgical History:  Procedure Laterality Date  . APPENDECTOMY  2011  . [place]   x 2  . FEMORAL HERNIA REPAIR  ~1978   left ?    Prior to Admission medications   Medication Sig Start Date End Date Taking? Authorizing Provider  ciprofloxacin-dexamethasone (CIPRODEX) OTIC suspension Place 4 drops into the right ear 2 (two) times daily. 05/27/17 06/03/17  Little, Traci M, PA-C  cyanocobalamin 1000 MCG tablet Take 1,000 mcg by mouth every other day.    [provider]  Multiple Vitamin (MULTIVITAMIN) tablet Take 1 tablet by mouth daily.    [provider]     Allergies Erythromycin base  Family History  Problem Relation Age of Onset  . Heart disease Maternal Grandmother        died of sudden MI  . Diabetes Other        very prevalent  . Diabetes Paternal Uncle   . Cancer Neg Hx     Social History Social History  Substance Use Topics  . Smoking status: Never Smoker  . Smokeless tobacco: Never Used  . Alcohol use No    Review of Systems Constitutional: Negative for fever/chills Eyes: No visual changes. ENT:  Negative for sore throat and for difficulty swallowing. External right ear pain, tragus pain and since of right ear fullness Cardiovascular: Denies chest pain. Respiratory: Denies cough. Denies shortness of breath. Gastrointestinal: No abdominal pain.  No nausea, vomiting, diarrhea. Genitourinary: Negative for dysuria. Musculoskeletal: Negative for back pain. Skin: Negative for rash. Neurological: Negative for headaches.  Negative focal weakness or numbness. Negative for loss of consciousness. Able to ambulate. ____________________________________________   PHYSICAL EXAM:  VITAL SIGNS: ED Triage Vitals [05/27/17 1811]  Enc Vitals Group     BP 130/90     Pulse Rate 93     Resp 18     Temp 98.1 F (36.7 C)     Temp Source Oral     SpO2 100 %     Weight 160 lb (72.6 kg)     Height 5\' 6"  (1.676 m)     Head Circumference      Peak Flow      Pain Score 10     Pain Loc  Pain Edu?      Excl. in [place]?     Constitutional: Alert and oriented. Well appearing and in no acute distress.  Eyes: Conjunctivae are normal. PERRL. EOMI  Head: Normocephalic and atraumatic. ENT:      Ears: Right ear canal erythematous and edematous. Right tympanic membrane pearly gray nonbulging however with fluid. Left ear canal normal with tympanic membrane pearly gray. Right tragus tenderness, palpable tenderness along the external ear.      Nose: No congestion/rhinnorhea.      Mouth/Throat: Mucous membranes are moist. Oropharynx  nonerythematous or edematous. Tonsils Bilaterally symmetrical. Neck:Supple. No thyromegaly. No stridor.  Cardiovascular: Normal rate, regular rhythm. Normal S1 and S2.  Good peripheral circulation. Respiratory: Normal respiratory effort without tachypnea or retractions. Lungs CTAB. No wheezes/rales/rhonchi. Good air entry to the bases with no decreased or absent breath sounds. Hematological/Lymphatic/Immunological: No cervical lymphadenopathy. Cardiovascular: Normal rate, regular rhythm. Normal distal pulses. Gastrointestinal: Bowel sounds 4 quadrants. Soft and nontender to palpation. No guarding or rigidity. No palpable masses. No distention. No CVA tenderness. Musculoskeletal: Nontender with normal range of motion in all extremities. Neurologic: Normal speech and language. Skin:  Skin is warm, dry and intact. No rash noted. Psychiatric: Mood and affect are normal. Speech and behavior are normal. Patient exhibits appropriate insight and judgement.  ____________________________________________   LABS (all labs ordered are listed, but only abnormal results are displayed)  Labs Reviewed - No data to display ____________________________________________  EKG None ____________________________________________  RADIOLOGY None ____________________________________________   PROCEDURES  Procedure(s) performed: No    Critical Care performed: no ____________________________________________   INITIAL IMPRESSION / ASSESSMENT AND PLAN / ED COURSE  Pertinent labs & imaging results that were available during my care of the patient were reviewed by me and considered in my medical decision making (see chart for details).   Patient presents to the emergency department with right ear pain and ear fullness.Marland Kitchen History and physical exam are reassuring symptoms are consistent with right ear otitis externa. Patient will be prescribed Ciprodex eardrops for coverage. Patient advised to continue  amoxicillin prescription. Physical exam is reassuring at this time. Patient informed of clinical course, understand medical decision-making process, and agree with plan. Patient was advised to follow up with primary care provider as needed and was also advised to return to the emergency department for symptoms that change or worsen. Patient informed of clinical course, understand medical decision-making process, and agree with plan. ____________________________________________   FINAL CLINICAL IMPRESSION(S) / ED DIAGNOSES  Final diagnoses:  Otitis externa of right ear, unspecified chronicity, unspecified type       NEW MEDICATIONS STARTED DURING THIS VISIT:  Discharge Medication List as of 05/27/2017  7:11 PM    START taking these medications   Details  ciprofloxacin-dexamethasone (CIPRODEX) OTIC suspension Place 4 drops into the right ear 2 (two) times daily., Starting Sun 05/27/2017, Until Sun 06/03/2017, Print         Note:  This document was prepared using Dragon voice recognition software and may include unintentional dictation errors.    Little, Fleet Contras 05/28/17 Kreg Shropshire, MD 06/04/17 [phone]

## 2017-05-30 ENCOUNTER — Encounter: Payer: Self-pay | Admitting: Internal Medicine

## 2017-05-30 ENCOUNTER — Ambulatory Visit (INDEPENDENT_AMBULATORY_CARE_PROVIDER_SITE_OTHER): Payer: 59 | Admitting: Internal Medicine

## 2017-05-30 VITALS — BP 118/78 | HR 130 | Temp 98.1°F | Ht 66.0 in | Wt 165.8 lb

## 2017-05-30 DIAGNOSIS — H6090 Unspecified otitis externa, unspecified ear: Secondary | ICD-10-CM | POA: Insufficient documentation

## 2017-05-30 DIAGNOSIS — H60501 Unspecified acute noninfective otitis externa, right ear: Secondary | ICD-10-CM | POA: Diagnosis not present

## 2017-05-30 MED ORDER — PREDNISONE 20 MG PO TABS: 40.0000 mg | ORAL_TABLET | Freq: Every day | ORAL | 0 refills | Status: DC

## 2017-05-30 NOTE — Patient Instructions (Signed)
Ask the pharmacist about sweet oil that you can warm and put in your ear. Let me know if you are not improving by tomorrow evening or Friday morming

## 2017-05-30 NOTE — Assessment & Plan Note (Signed)
Puzzling to have severe symptoms with no known insult Will continue the ciprodex Increase NSAID dose Try 3 days of prednisone ENT if not better in 48 hours

## 2017-05-30 NOTE — Progress Notes (Signed)
   Subjective:    Patient ID: Erin Fuentes, female    DOB: 1971/01/25, 46 y.o.   MRN: [id number]  HPI Here for ER follow up  Here due to ongoing right ear pressure Feels "like there is a water balloon in my ear" Some discharge recently  Hearing is off No ringing but has abnormal sensation of sound  Tried motrin and aleve--- slight help  walkin clinic 8/18 and got amoxil Then ER 8/19 due to worsened pain  Current Outpatient Prescriptions on File Prior to Visit  Medication Sig Dispense Refill  . ciprofloxacin-dexamethasone (CIPRODEX) OTIC suspension Place 4 drops into the right ear 2 (two) times daily. 7.5 mL 0  . cyanocobalamin 1000 MCG tablet Take 1,000 mcg by mouth every other day.    . Multiple Vitamin (MULTIVITAMIN) tablet Take 1 tablet by mouth daily.     No current facility-administered medications on file prior to visit.     Allergies  Allergen Reactions  . Erythromycin Base Diarrhea    Past Medical History:  Diagnosis Date  . MVP (mitral valve prolapse)   . Vitamin B12 deficiency    oral therapy effective    Past Surgical History:  Procedure Laterality Date  . APPENDECTOMY  2011  . [place]   x 2  . FEMORAL HERNIA REPAIR  ~1978   left ?    Family History  Problem Relation Age of Onset  . Heart disease Maternal Grandmother        died of sudden MI  . Diabetes Other        very prevalent  . Diabetes Paternal Uncle   . Cancer Neg Hx     Social History   Social History  . Marital status: Single    Spouse name: N/A  . Number of children: 2  . Years of education: N/A   Occupational History  . 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 Smoking status: Never Smoker  . Smokeless tobacco: Never Used  . Alcohol use No  . Drug use: No  . Sexual activity: Not on file   Other Topics Concern  . Not on file   Social History Narrative  . No narrative on file   Review of Systems No  fever Couldn't work yesterday No cold or sinus symptoms No swimming or ear trauma Not like her facial pain    Objective:   Physical Exam  Constitutional: She appears well-nourished. No distress.  HENT:  Mild right tragal tenderness Swelling and discharge in canal on right--- tenderness with speculum TM looks okay          Assessment & Plan:

## 2017-06-01 ENCOUNTER — Ambulatory Visit: Payer: 59 | Admitting: Internal Medicine

## 2017-10-16 ENCOUNTER — Ambulatory Visit: Payer: 59 | Admitting: Internal Medicine

## 2017-10-16 ENCOUNTER — Encounter: Payer: Self-pay | Admitting: Internal Medicine

## 2017-10-16 VITALS — BP 114/84 | HR 81 | Temp 98.0°F | Wt 165.0 lb

## 2017-10-16 DIAGNOSIS — R5383 Other fatigue: Secondary | ICD-10-CM | POA: Insufficient documentation

## 2017-10-16 DIAGNOSIS — R21 Rash and other nonspecific skin eruption: Secondary | ICD-10-CM | POA: Diagnosis not present

## 2017-10-16 LAB — VITAMIN B12: Vitamin B-12: 318 pg/mL (ref 211–911)

## 2017-10-16 LAB — COMPREHENSIVE METABOLIC PANEL
ALBUMIN: 4.3 g/dL (ref 3.5–5.2)
ALK PHOS: 55 U/L (ref 39–117)
ALT: 12 U/L (ref 0–35)
AST: 19 U/L (ref 0–37)
BUN: 14 mg/dL (ref 6–23)
CO2: 28 mEq/L (ref 19–32)
CREATININE: 0.83 mg/dL (ref 0.40–1.20)
Calcium: 9.1 mg/dL (ref 8.4–10.5)
Chloride: 104 mEq/L (ref 96–112)
GFR: 78.1 mL/min (ref >60.00)
GLUCOSE: 98 mg/dL (ref 70–99)
Potassium: 3.7 mEq/L (ref 3.5–5.1)
SODIUM: 139 meq/L (ref 135–145)
TOTAL PROTEIN: 7 g/dL (ref 6.0–8.3)
Total Bilirubin: 0.9 mg/dL (ref 0.2–1.2)

## 2017-10-16 LAB — T4, FREE: Free T4: 0.8 ng/dL (ref 0.60–1.60)

## 2017-10-16 LAB — CBC
HCT: 39.6 % (ref 36.0–46.0)
Hemoglobin: 13.3 g/dL (ref 12.0–15.0)
MCHC: 33.7 g/dL (ref 30.0–36.0)
MCV: 92.9 fl (ref 78.0–100.0)
Platelets: 339 10*3/uL (ref 150.0–400.0)
RBC: 4.27 Mil/uL (ref 3.87–5.11)
RDW: 12.2 % (ref 11.5–15.5)
WBC: 5.1 10*3/uL (ref 4.0–10.5)

## 2017-10-16 MED ORDER — HYDROCORTISONE 2.5 % EX CREA: TOPICAL_CREAM | Freq: Three times a day (TID) | CUTANEOUS | 3 refills | Status: DC | PRN

## 2017-10-16 NOTE — Progress Notes (Signed)
Subjective:    Patient ID: Erin Fuentes, female    DOB: March 29, 1971, 47 y.o.   MRN: [id number]  HPI Here due to fatigue and skin problems  Also has noted some red spots under her lips They come and go---very red at first Some itching Has noticed it for just over a month Nothing inside mouth No history of cold sores Some soreness at corner of mouth as well  Also has noticed fatigue Energy levels are not great Generally sleeps okay Work is okay---due for vacation now (has 3 days off now) Doesn't feel depressed--not prone to seasonal problems   Current Outpatient Medications on File Prior to Visit  Medication Sig Dispense Refill  . cyanocobalamin 1000 MCG tablet Take 1,000 mcg by mouth every other day.    . Multiple Vitamin (MULTIVITAMIN) tablet Take 1 tablet by mouth daily.     No current facility-administered medications on file prior to visit.     Allergies  Allergen Reactions  . Erythromycin Base Diarrhea    Past Medical History:  Diagnosis Date  . MVP (mitral valve prolapse)   . Vitamin B12 deficiency    oral therapy effective    Past Surgical History:  Procedure Laterality Date  . APPENDECTOMY  2011  . [place]   x 2  . FEMORAL HERNIA REPAIR  ~1978   left ?    Family History  Problem Relation Age of Onset  . Heart disease Maternal Grandmother        died of sudden MI  . Diabetes Other        very prevalent  . Diabetes Paternal Uncle   . Cancer Neg Hx     Social History   Socioeconomic History  . Marital status: Single    Spouse name: Not on file  . Number of children: 2  . Years of education: Not on file  . Highest education level: Not on file  Social Needs  . Financial resource strain: Not on file  . Food insecurity - worry: Not on file  . Food insecurity - inability: Not on file  . Transportation needs - medical: Not on file  . Transportation needs - non-medical: Not on file  Occupational History  . Occupation:  Contractor in [place]  Tobacco Use  . Smoking status: Never Smoker  . Smokeless tobacco: Never Used  Substance and Sexual Activity  . Alcohol use: No  . Drug use: No  . Sexual activity: Not on file  Other Topics Concern  . Not on file  Social History Narrative  . Not on file   Review of Systems Periods are regular but fewer days  Appetite is fine Weight is stable Has been walking--and uses treadmill No fever No cough or SOB No swollen glands Bowels regular No N/V    Objective:   Physical Exam  Constitutional: She appears well-developed. No distress.  HENT:  No oral lesions  Neck: No thyromegaly present.  Pulmonary/Chest: Effort normal and breath sounds normal. No respiratory distress. She has no wheezes. She has no rales.  Abdominal: Soft. She exhibits no distension and no mass. There is no tenderness. There is no rebound and no guarding.  No HSM  Musculoskeletal: She exhibits no edema or tenderness.  No joint swelling  Lymphadenopathy:    She has no cervical adenopathy.    She has no axillary adenopathy.       Right: No inguinal adenopathy present.  Left: No inguinal adenopathy present.  Skin:  Slight redness under lower lip and at corners of mouth. No vesicles/papules  Psychiatric: She has a normal mood and affect. Her behavior is normal.          Assessment & Plan:

## 2017-10-16 NOTE — Assessment & Plan Note (Signed)
Perioral Doesn't appear to be herpetic ?vitamin deficiency? Will have her take better vitamin with zinc HC 2.5% prn

## 2017-10-16 NOTE — Assessment & Plan Note (Signed)
No worrisome features on history or PE ?seasonal, no recent vacation? Not depressed Will check labs

## 2017-10-16 NOTE — Patient Instructions (Signed)
Try a multivitamin with zinc for at least 1 month to see if it helps the rash.

## 2018-01-02 ENCOUNTER — Ambulatory Visit: Payer: 59 | Admitting: Primary Care

## 2018-01-02 ENCOUNTER — Encounter: Payer: Self-pay | Admitting: Primary Care

## 2018-01-02 VITALS — BP 122/76 | HR 82 | Temp 98.0°F | Resp 18 | Ht 66.0 in | Wt 168.5 lb

## 2018-01-02 DIAGNOSIS — R05 Cough: Secondary | ICD-10-CM | POA: Diagnosis not present

## 2018-01-02 DIAGNOSIS — J22 Unspecified acute lower respiratory infection: Secondary | ICD-10-CM

## 2018-01-02 DIAGNOSIS — R059 Cough, unspecified: Secondary | ICD-10-CM

## 2018-01-02 MED ORDER — AMOXICILLIN 875 MG PO TABS: 875.0000 mg | ORAL_TABLET | Freq: Two times a day (BID) | ORAL | 0 refills | Status: AC

## 2018-01-02 NOTE — Progress Notes (Signed)
Subjective:    Patient ID: Erin Fuentes, female    DOB: 1970-11-26, 47 y.o.   MRN: [id number]  HPI  Erin Fuentes is a 47 year old female who presents today with a chief complaint of cough.   She also reports chest congestion, fatigue, fevers. Her symptoms began one week ago and have continued since. Her fevers are low grade and] she's been taking Tylenol Cold and Sinus without much improvement. She works with the public and is exposed to cough/cold symptoms often. Her cough is worse at night, is productive with green/yellow sputum.   Review of Systems  Constitutional: Positive for fatigue and fever. Negative for chills.  HENT: Positive for congestion, postnasal drip and sinus pressure.   Respiratory: Positive for cough. Negative for shortness of breath.   Cardiovascular: Negative for chest pain.       Past Medical History:  Diagnosis Date  . MVP (mitral valve prolapse)   . Vitamin B12 deficiency    oral therapy effective     Social History   Socioeconomic History  . Marital status: Single    Spouse name: Not on file  . Number of children: 2  . Years of education: Not on file  . Highest education level: Not on file  Occupational History  . Occupation: Contractor in [place]  . Financial resource strain: Not on file  . Food insecurity:    Worry: Not on file    Inability: Not on file  . Transportation needs:    Medical: Not on file    Non-medical: Not on file  Tobacco Use  . Smoking status: Never Smoker  . Smokeless tobacco: Never Used  Substance and Sexual Activity  . Alcohol use: No  . Drug use: No  . Sexual activity: Not on file  Lifestyle  . Physical activity:    Days per week: Not on file    Minutes per session: Not on file  . Stress: Not on file  Relationships  . Social connections:    Talks on phone: Not on file    Gets together: Not on file    Attends religious service: Not on file    Active member of club  or organization: Not on file    Attends meetings of clubs or organizations: Not on file    Relationship status: Not on file  . Intimate partner violence:    Fear of current or ex partner: Not on file    Emotionally abused: Not on file    Physically abused: Not on file    Forced sexual activity: Not on file  Other Topics Concern  . Not on file  Social History Narrative  . Not on file    Past Surgical History:  Procedure Laterality Date  . APPENDECTOMY  2011  . [place]   x 2  . FEMORAL HERNIA REPAIR  ~1978   left ?    Family History  Problem Relation Age of Onset  . Heart disease Maternal Grandmother        died of sudden MI  . Diabetes Other        very prevalent  . Diabetes Paternal Uncle   . Cancer Neg Hx     Allergies  Allergen Reactions  . Erythromycin Base Diarrhea    Current Outpatient Medications on File Prior to Visit  Medication Sig Dispense Refill  . cyanocobalamin 1000 MCG tablet Take 1,000 mcg by mouth every  other day.    . hydrocortisone 2.5 % cream Apply topically 3 (three) times daily as needed. 28 g 3  . Multiple Vitamin (MULTIVITAMIN) tablet Take 1 tablet by mouth daily.     No current facility-administered medications on file prior to visit.     BP 122/76   Pulse 82   Temp 98 F (36.7 C) (Oral)   Resp 18   Ht 5\' 6"  (1.676 m)   Wt 168 lb 8 oz (76.4 kg)   LMP 12/29/2017   SpO2 98%   BMI 27.20 kg/m    Objective:   Physical Exam  Constitutional: She appears well-nourished. She appears ill.  HENT:  Right Ear: Tympanic membrane and ear canal normal.  Left Ear: Tympanic membrane and ear canal normal.  Nose: Right sinus exhibits no maxillary sinus tenderness and no frontal sinus tenderness. Left sinus exhibits no maxillary sinus tenderness and no frontal sinus tenderness.  Mouth/Throat: Oropharynx is clear and moist.  Eyes: Conjunctivae are normal.  Neck: Neck supple.  Cardiovascular: Normal rate and regular rhythm.    Pulmonary/Chest: Effort normal. She has no decreased breath sounds. She has no wheezes. She has rhonchi in the right upper field and the right lower field. She has no rales.  Lymphadenopathy:    She has no cervical adenopathy.  Skin: Skin is warm and dry.          Assessment & Plan:  Acute Bronchitis:  Cough, congestion, fatigue, fevers x 1 week. Little improvement with OTC treatment. Exam today with moderate rhonchi, especially to right lower fields. Given duration of symptoms coupled with exam, will treat. Rx for Amoxil course sent to pharmacy. She would like to start with Delsym OTC and will call if this is not helpful. Fluids, rest, follow up PRN.  Pleas Koch, NP

## 2018-01-02 NOTE — Patient Instructions (Addendum)
You have a Lower Respiratory Tract Infection and we are giving you Amoxicillin 875mg  tabs, take 1 tab twice daily for 7 days. Take all 7 days even if you start feeling better.  For your cough you can take Delsym per manufacturer's recommendations. If you have no relief, please gie [place] a call and we will prescribe Tessalon pearls.  Please follow up if you do not start felling better in the next 3 days or sooner if you feel worse.   It has been a pleasure seeing you today. Denita Lung, RN, Adult-Geriatric Nurse Practitioner Student and Allie Bossier, AGNP  Amoxicillin capsules or tablets What is this medicine? AMOXICILLIN (a mox i SIL in) is a penicillin antibiotic. It is used to treat certain kinds of bacterial infections. It will not work for colds, flu, or other viral infections. This medicine may be used for other purposes; ask your health care provider or pharmacist if you have questions. COMMON BRAND NAME(S): Amoxil, Moxilin, Sumox, Trimox What should I tell my health care provider before I take this medicine? They need to know if you have any of these conditions: -asthma -kidney disease -an unusual or allergic reaction to amoxicillin, other penicillins, cephalosporin antibiotics, other medicines, foods, dyes, or preservatives -pregnant or trying to get pregnant -breast-feeding How should I use this medicine? Take this medicine by mouth with a glass of water. Follow the directions on your prescription label. You may take this medicine with food or on an empty stomach. Take your medicine at regular intervals. Do not take your medicine more often than directed. Take all of your medicine as directed even if you think your are better. Do not skip doses or stop your medicine early. Talk to your pediatrician regarding the use of this medicine in children. While this drug may be prescribed for selected conditions, precautions do apply. Overdosage: If you think you have taken too much of this  medicine contact a poison control center or emergency room at once. NOTE: This medicine is only for you. Do not share this medicine with others. What if I miss a dose? If you miss a dose, take it as soon as you can. If it is almost time for your next dose, take only that dose. Do not take double or extra doses. What may interact with this medicine? -amiloride -birth control pills -chloramphenicol -macrolides -probenecid -sulfonamides -tetracyclines This list may not describe all possible interactions. Give your health care provider a list of all the medicines, herbs, non-prescription drugs, or dietary supplements you use. Also tell them if you smoke, drink alcohol, or use illegal drugs. Some items may interact with your medicine. What should I watch for while using this medicine? Tell your doctor or health care professional if your symptoms do not improve in 2 or 3 days. Take all of the doses of your medicine as directed. Do not skip doses or stop your medicine early. If you are diabetic, you may get a false positive result for sugar in your urine with certain brands of urine tests. Check with your doctor. Do not treat diarrhea with over-the-counter products. Contact your doctor if you have diarrhea that lasts more than 2 days or if the diarrhea is severe and watery. What side effects may I notice from receiving this medicine? Side effects that you should report to your doctor or health care professional as soon as possible: -allergic reactions like skin rash, itching or hives, swelling of the face, lips, or tongue -breathing problems -dark urine -  redness, blistering, peeling or loosening of the skin, including inside the mouth -seizures -severe or watery diarrhea -trouble passing urine or change in the amount of urine -unusual bleeding or bruising -unusually weak or tired -yellowing of the eyes or skin Side effects that usually do not require medical attention (report to your doctor or  health care professional if they continue or are bothersome): -dizziness -headache -stomach upset -trouble sleeping This list may not describe all possible side effects. Call your doctor for medical advice about side effects. You may report side effects to FDA at 1-800-FDA-1088. Where should I keep my medicine? Keep out of the reach of children. Store between 68 and 77 degrees F (20 and 25 degrees C). Keep bottle closed tightly. Throw away any unused medicine after the expiration date. NOTE: This sheet is a summary. It may not cover all possible information. If you have questions about this medicine, talk to your doctor, pharmacist, or health care provider.  2018 Elsevier/Gold Standard (2007-12-17 14:10:59)

## 2018-01-02 NOTE — Progress Notes (Addendum)
Subjective:    Patient ID: Erin Fuentes, female    DOB: 03/30/1971, 47 y.o.   MRN: [id number]  HPI:  Erin Fuentes is a 47 y.o. female who presents today with a CC of productive cough. Started a week ago and has also had a sore throat (L>R) with nasal drainage. Does produce yellow and green sputum. Has also had on and off low grade fevers. Is having poor sleep d/t to cough and sore throat. Denies chest pain and SOB. Has been taking APAP Cold and Sinus with little relief. Has taken Nyquil, Pseudophed, and Alkaseltzer with no relief.   Review of Systems  Constitutional: Positive for fatigue.  HENT: Positive for congestion, nosebleeds and postnasal drip. Negative for ear pain and sinus pressure.   Respiratory: Positive for cough. Negative for chest tightness.        Past Medical History:  Diagnosis Date  . MVP (mitral valve prolapse)   . Vitamin B12 deficiency    oral therapy effective   Past Surgical History:  Procedure Laterality Date  . APPENDECTOMY  2011  . [place]   x 2  . FEMORAL HERNIA REPAIR  ~1978   left ?   Social History   Socioeconomic History  . Marital status: Single    Spouse name: Not on file  . Number of children: 2  . Years of education: Not on file  . Highest education level: Not on file  Occupational History  . 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 Financial resource strain: Not on file  . Food insecurity:    Worry: Not on file    Inability: Not on file  . Transportation needs:    Medical: Not on file    Non-medical: Not on file  Tobacco Use  . Smoking status: Never Smoker  . Smokeless tobacco: Never Used  Substance and Sexual Activity  . Alcohol use: No  . Drug use: No  . Sexual activity: Not on file  Lifestyle  . Physical activity:    Days per week: Not on file    Minutes per session: Not on file  . Stress: Not on file  Relationships  . Social connections:    Talks on  phone: Not on file    Gets together: Not on file    Attends religious service: Not on file    Active member of club or organization: Not on file    Attends meetings of clubs or organizations: Not on file    Relationship status: Not on file  . Intimate partner violence:    Fear of current or ex partner: Not on file    Emotionally abused: Not on file    Physically abused: Not on file    Forced sexual activity: Not on file  Other Topics Concern  . Not on file  Social History Narrative  . Not on file   Family History  Problem Relation Age of Onset  . Heart disease Maternal Grandmother        died of sudden MI  . Diabetes Other        very prevalent  . Diabetes Paternal Uncle   . Cancer Neg Hx    Current Outpatient Medications on File Prior to Visit  Medication Sig Dispense Refill  . cyanocobalamin 1000 MCG tablet Take 1,000 mcg by mouth every other day.    . hydrocortisone 2.5 % cream Apply topically 3 (three) times daily as  needed. 28 g 3  . Multiple Vitamin (MULTIVITAMIN) tablet Take 1 tablet by mouth daily.     No current facility-administered medications on file prior to visit.     Objective:   Physical Exam  Constitutional: She appears well-nourished. No distress.  HENT:  Right Ear: Tympanic membrane normal. Tympanic membrane is not erythematous and not bulging.  Left Ear: Tympanic membrane normal. Tympanic membrane is not erythematous and not bulging.  Nose: Nose normal. No mucosal edema. Right sinus exhibits no maxillary sinus tenderness and no frontal sinus tenderness. Left sinus exhibits no maxillary sinus tenderness and no frontal sinus tenderness.  Mouth/Throat: Oropharynx is clear and moist and mucous membranes are normal.  Neck: Neck supple.  Cardiovascular: Normal rate, regular rhythm and normal heart sounds. Exam reveals no gallop and no friction rub.  No murmur heard. Pulmonary/Chest: Effort normal. She has rhonchi in the right lower field and the left lower  field.  Lymphadenopathy:       Head (right side): Tonsillar adenopathy present.       Head (left side): Tonsillar adenopathy present.    She has no cervical adenopathy.    BP 122/76   Pulse 82   Temp 98 F (36.7 C) (Oral)   Resp 18   Ht 5\' 6"  (1.676 m)   Wt 168 lb 8 oz (76.4 kg)   LMP 12/29/2017   SpO2 98%   BMI 27.20 kg/m       Assessment & Plan:   1. Cough - Delsym as needed, call if ineffective and will do Tessalon Pearls  2. Acute lower respiratory tract infection - Amoxicillin 875mg  Tablet. Take 1 tablet twice daily for 7 days   Denita Lung, RN, Adult-Geriatric Nurse Practitioner Student

## 2018-02-14 ENCOUNTER — Encounter: Payer: 59 | Admitting: Internal Medicine

## 2018-02-27 ENCOUNTER — Encounter: Payer: 59 | Admitting: Internal Medicine

## 2018-03-08 ENCOUNTER — Emergency Department
Admission: EM | Admit: 2018-03-08 | Discharge: 2018-03-08 | Disposition: A | Discharge status: Home / Self Care | Payer: 59 | Attending: Emergency Medicine | Admitting: Emergency Medicine

## 2018-03-08 ENCOUNTER — Emergency Department: Payer: 59

## 2018-03-08 ENCOUNTER — Other Ambulatory Visit: Payer: Self-pay

## 2018-03-08 DIAGNOSIS — R51 Headache: Secondary | ICD-10-CM | POA: Insufficient documentation

## 2018-03-08 DIAGNOSIS — G8194 Hemiplegia, unspecified affecting left nondominant side: Secondary | ICD-10-CM

## 2018-03-08 DIAGNOSIS — Z79899 Other long term (current) drug therapy: Secondary | ICD-10-CM | POA: Diagnosis not present

## 2018-03-08 DIAGNOSIS — M79605 Pain in left leg: Secondary | ICD-10-CM | POA: Diagnosis not present

## 2018-03-08 DIAGNOSIS — R61 Generalized hyperhidrosis: Secondary | ICD-10-CM | POA: Diagnosis not present

## 2018-03-08 DIAGNOSIS — R202 Paresthesia of skin: Secondary | ICD-10-CM | POA: Diagnosis not present

## 2018-03-08 DIAGNOSIS — R0789 Other chest pain: Secondary | ICD-10-CM | POA: Insufficient documentation

## 2018-03-08 DIAGNOSIS — R531 Weakness: Secondary | ICD-10-CM | POA: Insufficient documentation

## 2018-03-08 DIAGNOSIS — M79602 Pain in left arm: Secondary | ICD-10-CM | POA: Diagnosis not present

## 2018-03-08 DIAGNOSIS — R110 Nausea: Secondary | ICD-10-CM | POA: Insufficient documentation

## 2018-03-08 LAB — PROTIME-INR
INR: 1.01
Prothrombin Time: 13.2 seconds (ref 11.4–15.2)

## 2018-03-08 LAB — URINE DRUG SCREEN, QUALITATIVE (ARMC ONLY)
Amphetamines, Ur Screen: NOT DETECTED
Barbiturates, Ur Screen: NOT DETECTED
Benzodiazepine, Ur Scrn: NOT DETECTED
CANNABINOID 50 NG, UR ~~LOC~~: NOT DETECTED
COCAINE METABOLITE, UR ~~LOC~~: NOT DETECTED
MDMA (Ecstasy)Ur Screen: NOT DETECTED
Methadone Scn, Ur: NOT DETECTED
Opiate, Ur Screen: NOT DETECTED
PHENCYCLIDINE (PCP) UR S: NOT DETECTED
TRICYCLIC, UR SCREEN: NOT DETECTED

## 2018-03-08 LAB — CBC
HEMATOCRIT: 39.8 % (ref 35.0–47.0)
HEMOGLOBIN: 13.7 g/dL (ref 12.0–16.0)
MCH: 31.6 pg (ref 26.0–34.0)
MCHC: 34.5 g/dL (ref 32.0–36.0)
MCV: 91.6 fL (ref 80.0–100.0)
Platelets: 289 10*3/uL (ref 150–440)
RBC: 4.34 MIL/uL (ref 3.80–5.20)
RDW: 12.4 % (ref 11.5–14.5)
WBC: 7.4 10*3/uL (ref 3.6–11.0)

## 2018-03-08 LAB — COMPREHENSIVE METABOLIC PANEL
ALK PHOS: 53 U/L (ref 38–126)
ALT: 17 U/L (ref 14–54)
AST: 35 U/L (ref 15–41)
Albumin: 4 g/dL (ref 3.5–5.0)
Anion gap: 10 (ref 5–15)
BUN: 12 mg/dL (ref 6–20)
CO2: 20 mmol/L — AB (ref 22–32)
CREATININE: 0.83 mg/dL (ref 0.44–1.00)
Calcium: 9 mg/dL (ref 8.9–10.3)
Chloride: 107 mmol/L (ref 101–111)
GFR calc non Af Amer: >60 mL/min (ref >60)
Glucose, Bld: 102 mg/dL — ABNORMAL HIGH (ref 65–99)
Potassium: 3.7 mmol/L (ref 3.5–5.1)
SODIUM: 137 mmol/L (ref 135–145)
Total Bilirubin: 1.3 mg/dL — ABNORMAL HIGH (ref 0.3–1.2)
Total Protein: 7.1 g/dL (ref 6.5–8.1)

## 2018-03-08 LAB — URINALYSIS, ROUTINE W REFLEX MICROSCOPIC
Bilirubin Urine: NEGATIVE
GLUCOSE, UA: NEGATIVE mg/dL
Hgb urine dipstick: NEGATIVE
KETONES UR: 5 mg/dL — AB
NITRITE: NEGATIVE
PROTEIN: NEGATIVE mg/dL
Specific Gravity, Urine: 1.008 (ref 1.005–1.030)
pH: 8 (ref 5.0–8.0)

## 2018-03-08 LAB — TROPONIN I: Troponin I: <0.03 ng/mL (ref <0.03)

## 2018-03-08 LAB — DIFFERENTIAL
Basophils Absolute: 0 10*3/uL (ref 0–0.1)
Basophils Relative: 0 %
Eosinophils Absolute: 0.1 10*3/uL (ref 0–0.7)
Eosinophils Relative: 2 %
Lymphocytes Relative: 24 %
Lymphs Abs: 1.8 10*3/uL (ref 1.0–3.6)
MONO ABS: 0.5 10*3/uL (ref 0.2–0.9)
Monocytes Relative: 6 %
NEUTROS ABS: 5 10*3/uL (ref 1.4–6.5)
Neutrophils Relative %: 68 %

## 2018-03-08 LAB — APTT: aPTT: 26 seconds (ref 24–36)

## 2018-03-08 LAB — POCT PREGNANCY, URINE: PREG TEST UR: NEGATIVE

## 2018-03-08 LAB — GLUCOSE, CAPILLARY: GLUCOSE-CAPILLARY: 88 mg/dL (ref 65–99)

## 2018-03-08 LAB — ETHANOL

## 2018-03-08 MED ORDER — IOPAMIDOL (ISOVUE-370) INJECTION 76%
100.0000 mL | Freq: Once | INTRAVENOUS | Status: AC | PRN
  Administered 2018-03-08: 100 mL via INTRAVENOUS

## 2018-03-08 MED ORDER — ACETAMINOPHEN 325 MG PO TABS
ORAL_TABLET | ORAL | Status: AC
  Administered 2018-03-08: 650 mg via ORAL
  Filled 2018-03-08: qty 2

## 2018-03-08 MED ORDER — CEPHALEXIN 500 MG PO CAPS: 500.0000 mg | ORAL_CAPSULE | Freq: Four times a day (QID) | ORAL | 0 refills | Status: AC

## 2018-03-08 MED ORDER — ACETAMINOPHEN 325 MG PO TABS
650.0000 mg | ORAL_TABLET | Freq: Once | ORAL | Status: AC
  Administered 2018-03-08: 650 mg via ORAL

## 2018-03-08 MED ORDER — FOSFOMYCIN TROMETHAMINE 3 G PO PACK
3.0000 g | PACK | Freq: Once | ORAL | Status: AC
  Administered 2018-03-08: 3 g via ORAL
  Filled 2018-03-08: qty 3

## 2018-03-08 NOTE — ED Notes (Signed)
Pt still in MRI 

## 2018-03-08 NOTE — ED Notes (Signed)
Patient transported to CT 

## 2018-03-08 NOTE — Progress Notes (Signed)
At the patient's request, Chaplain left a message for her son, Cornelia Copa. Mason's telephone number is [phone].  Also, Chaplain met fiance, Joe, and facilitated a discussion with Dr. Doy Mince about the patient. Joe was not alarmed, as his mother had experienced multiple strokes. Joe will contact the patient's son, Cornelia Copa, and Jarrett Soho later.  Chaplain continued to provide presence and emotional support. Chaplain offered to return as needed and notified the Charge Nurse of departure.

## 2018-03-08 NOTE — ED Triage Notes (Signed)
Pt comes from home with c/o left sided headache, chest pain, arm pain and leg pain. Pt did take 650 mg aspirin. Per EMS pt was ST in 140s initially, but currently at 114. Pt is alert and oriented at this time. Pt did also experience some nausea.

## 2018-03-08 NOTE — Progress Notes (Signed)
Chaplain responded to Code Stroke and met the patient during the initial assessment for a stroke. Patient was in pain but requested that her fiancee Joe be contacted. Chaplain left an initial message notifying Joe, and a second message with the direct number for the ED. Joe's telephone number is [phone]. Chaplain followed the patient from CT back to the ED. Chaplain provided silent prayer and pastoral presence. Chaplain should be paged when Wille Glaser arrives.

## 2018-03-08 NOTE — ED Notes (Signed)
Patient transported to MRI 

## 2018-03-08 NOTE — ED Provider Notes (Signed)
patient's MRI was negative troponin was negative per Dr. Doy Mince recommendation I will let her complete her workup with her primary care doc and also give her referral to neurology outpatient. Patient is doing well at present.   Nena Polio, MD 03/08/18 [phone]

## 2018-03-08 NOTE — Discharge Instructions (Addendum)
Please follow-up with your regular doctor, give them a call Monday morning they can complete the workup for your episode of left arm weakness and numbness. Please return here if you have any further symptoms. Your urine did show some signs of an infection I will give you some antibiotics - Keflex - to take for that. You can also follow-up with Dr. Melrose Nakayama neurology outpatient. It will be harder to get an appointment with him though.

## 2018-03-08 NOTE — ED Notes (Signed)
Pt back from CT

## 2018-03-08 NOTE — ED Provider Notes (Signed)
[facility] Emergency Department Provider Note  Time seen: 12:01 PM  I have reviewed the triage vital signs and the nursing notes.   HISTORY  Chief Complaint Chest Pain and Arm Pain    HPI Erin Fuentes is a 47 y.o. female with a past medical history of vitamin B12 deficiency, mitral valve prolapse, presents to the emergency department for left-sided headache, left arm and leg weakness and pain.  According to the patient at 930 this morning she developed left-sided headache.  States a history of trigeminal neuralgia in the past, thought she would feel better if she could just get up and move around some.  States she went outside to walk her dogs and began experiencing chest pain with numbness and tingling in her left arm and leg followed by weakness in her left arm and leg.  Patient states she got very sweaty and nauseated.  Called EMS.  Patient states symptoms began around 9:30 AM.  Patient states that headache is mild at this time continues to have pain in the left arm, left chest and left leg.  Feels very weak in the left arm and left leg.   Past Medical History:  Diagnosis Date  . MVP (mitral valve prolapse)   . Vitamin B12 deficiency    oral therapy effective    Patient Active Problem List   Diagnosis Date Noted  . Rash of face 10/16/2017  . Fatigue 10/16/2017  . Otitis externa 05/30/2017  . Arthralgia 02/03/2016  . Perimenopausal disorder 11/04/2015  . Facial pain, atypical 01/14/2015  . Routine general medical examination at a health care facility 02/29/2012  . Vitamin B12 deficiency   . MITRAL VALVE PROLAPSE 08/09/2010    Past Surgical History:  Procedure Laterality Date  . APPENDECTOMY  2011  . [place]   x 2  . FEMORAL HERNIA REPAIR  ~1978   left ?    Prior to Admission medications   Medication Sig Start Date End Date Taking? Authorizing Provider  cyanocobalamin 1000 MCG tablet Take 1,000 mcg by mouth every other  day.    [provider]  hydrocortisone 2.5 % cream Apply topically 3 (three) times daily as needed. 10/16/17   Venia Carbon, MD  Multiple Vitamin (MULTIVITAMIN) tablet Take 1 tablet by mouth daily.    [provider]    Allergies  Allergen Reactions  . Erythromycin Base Diarrhea    Family History  Problem Relation Age of Onset  . Heart disease Maternal Grandmother        died of sudden MI  . Diabetes Other        very prevalent  . Diabetes Paternal Uncle   . Cancer Neg Hx     Social History Social History   Tobacco Use  . Smoking status: Never Smoker  . Smokeless tobacco: Never Used  Substance Use Topics  . Alcohol use: No  . Drug use: No    Review of Systems Constitutional: Negative for fever. Eyes: Negative for visual complaints ENT: Negative for recent illness/congestion Cardiovascular: Mild left-sided chest pain Respiratory: Negative for shortness of breath. Gastrointestinal: Negative for abdominal pain, vomiting and diarrhea. Genitourinary: Negative for urinary compaints Musculoskeletal: States pain in left arm and left leg.  States weakness in the arm and leg. Skin: Negative for skin complaints  Neurological: Mild left-sided headache.  States pain weakness and numbness/tingling in the left arm and left leg. All other ROS negative  ____________________________________________   PHYSICAL EXAM:  Constitutional: Alert and oriented.  Keeps her eyes closed through most of the exam. Eyes: Normal exam ENT   Head: Normocephalic and atraumatic.   Mouth/Throat: Mucous membranes are moist. Cardiovascular: Normal rate, regular rhythm. No murmur Respiratory: Normal respiratory effort without tachypnea nor retractions. Breath sounds are clear Gastrointestinal: Soft and nontender. No distention.   Musculoskeletal: Nontender with normal range of motion in all extremities. No lower extremity tenderness or edema. Neurologic:  Normal speech and  language.  No cranial nerve deficits.  Patient has 3/5 motor in the left upper and left lower extremities.  States tingling/paresthesias as well as mild pain.  Skin:  Skin is warm, dry and intact.  Psychiatric: Mood and affect are normal.  ____________________________________________    EKG  EKG reviewed and interpreted by myself shows sinus tachycardia 114 bpm with a narrow QRS, normal axis, normal intervals, no concerning ST changes.  ____________________________________________    RADIOLOGY  CT negative for acute abnormality of the brain. CT angiography chest negative for dissection. MRI pending  ____________________________________________   INITIAL IMPRESSION / ASSESSMENT AND PLAN / ED COURSE  Pertinent labs & imaging results that were available during my care of the patient were reviewed by me and considered in my medical decision making (see chart for details).  Patient presents the emergency department with left-sided headache, pain in the left chest, weakness numbness and tingling in the left arm and left leg.  Symptoms started at 930 this morning.  Given the patient's acute onset of symptoms with significant weakness on exam I have activated code stroke.  We will obtain urgent imaging of the head and neurology consultation.  Patient is now stating that the chest pain has worsened to 10 out of 10 chest pain.  Given her neurologic symptoms in 10 out of 10 chest pain will obtain an urgent CT angiography of the chest to rule out dissection.  Neurology is currently seeing the patient, they state they are considering TPA, will attempt to obtain an urgent CTA to evaluate for dissection.  I reviewed the patient's records she has no sign of past renal disease.  I discussed with the radiology technologist, we will proceed without labs.   Patient's labs are largely within normal limits besides possible urinary tract infection which I do not believe would be a cause of her complaints  today likely incidental finding.  We will dose a one-time dose of fosfomycin I have sent a urine culture.  Patient CT scan of the head shows no acute infarct or hemorrhage.  Patient CT scan of the chest shows no dissection or acute abnormality.  Patient has had improving neurological symptoms since arrival.  She is now moving her left arm and leg without any difficulty.  After discussion with Dr. Doy Mince of neurology we will obtain an MRI of the brain.  If the patient's MRI is normal and the patient continues to appear well, symptoms improve and a repeat troponin is negative I believe the patient would be safe for discharge home.  Patient states she is feeling much better at this time.   NIH Stroke Scale   Interval: Baseline Time: 12:06 PM Person Administering Scale: Harvest Dark  Administer stroke scale items in the order listed. Record performance in each category after each subscale exam. Do not go back and change scores. Follow directions provided for each exam technique. Scores should reflect what the patient does, not what the clinician thinks the patient can do. The clinician should record answers while administering the  exam and work quickly. Except where indicated, the patient should not be coached (i.e., repeated requests to patient to make a special effort).   1a  Level of consciousness: 0=alert; keenly responsive  1b. LOC questions:  0=Performs both tasks correctly  1c. LOC commands: 0=Performs both tasks correctly  2.  Best Gaze: 0=normal  3.  Visual: 0=No visual loss  4. Facial Palsy: 0=Normal symmetric movement  5a.  Motor left arm: 2=Some effort against gravity, limb cannot get to or maintain (if cured) 90 (or 45) degrees, drifts down to bed, but has some effort against gravity  5b.  Motor right arm: 0=No drift, limb holds 90 (or 45) degrees for full 10 seconds  6a. motor left leg: 2=Some effort against gravity, limb cannot get to or maintain (if cured) 90 (or 45) degrees,  drifts down to bed, but has some effort against gravity  6b  Motor right leg:  0=No drift, limb holds 90 (or 45) degrees for full 10 seconds  7. Limb Ataxia: 2=Present in two limbs  8.  Sensory: 1=Mild to moderate sensory loss; patient feels pinprick is less sharp or is dull on the affected side; there is a loss of superficial pain with pinprick but patient is aware She is being touched  9. Best Language:  0=No aphasia, normal  10. Dysarthria: 0=Normal  11. Extinction and Inattention: 0=No abnormality  12. Distal motor function: 0=Normal   Total:   7    ____________________________________________   FINAL CLINICAL IMPRESSION(S) / ED DIAGNOSES  Left sided weakness Chest pain   Harvest Dark, MD 03/08/18 1405

## 2018-03-08 NOTE — ED Notes (Signed)
Called lab to get results, they were going to look for the tube and post results.

## 2018-03-08 NOTE — Consult Note (Signed)
Referring Physician: Paduchowski    Chief Complaint: Left sided numbness and weakness, chest pain  HPI: Erin Fuentes is an 47 y.o. female who reports developing a headache not long after awakening this morning.  Patient has a history of headaches and continued with her routine.  Later when attempting to take her dog out noted left sided chest pain that radiated into the LUE.  Patient then noted left sided numbness and weakness.  Felt she was not safe to drive and called EMS at that time.   Rates chest pain at 10/10. Initial NIHSS of 6.    Date last known well: Date: 03/08/2018 Time last known well: Time: 09:00 tPA Given: No: Symptoms improving  Past Medical History:  Diagnosis Date  . MVP (mitral valve prolapse)   . Vitamin B12 deficiency    oral therapy effective    Past Surgical History:  Procedure Laterality Date  . APPENDECTOMY  2011  . [place]   x 2  . FEMORAL HERNIA REPAIR  ~1978   left ?    Family History  Problem Relation Age of Onset  . Heart disease Maternal Grandmother        died of sudden MI  . Diabetes Other        very prevalent  . Diabetes Paternal Uncle   . Cancer Neg Hx    Social History:  reports that she has never smoked. She has never used smokeless tobacco. She reports that she does not drink alcohol or use drugs.  Allergies:  Allergies  Allergen Reactions  . Erythromycin Base Diarrhea    Medications: I have reviewed the patient's current medications. Prior to Admission:  Prior to Admission medications   Medication Sig Start Date End Date Taking? Authorizing Provider  cyanocobalamin 1000 MCG tablet Take 1,000 mcg by mouth every other day.   Yes [provider]  hydrocortisone 2.5 % cream Apply topically 3 (three) times daily as needed. 10/16/17  Yes Viviana Simpler I, MD  MELATONIN PO Take by mouth at bedtime as needed (sleep).   Yes [provider]  Multiple Vitamin (MULTIVITAMIN) tablet Take 1 tablet  by mouth daily.   Yes [provider]     ROS: History obtained from the patient  General ROS: negative for - chills, fatigue, fever, night sweats, weight gain or weight loss Psychological ROS: negative for - behavioral disorder, hallucinations, memory difficulties, mood swings or suicidal ideation Ophthalmic ROS: negative for - blurry vision, double vision, eye pain or loss of vision ENT ROS: negative for - epistaxis, nasal discharge, oral lesions, sore throat, tinnitus or vertigo Allergy and Immunology ROS: negative for - hives or itchy/watery eyes Hematological and Lymphatic ROS: negative for - bleeding problems, bruising or swollen lymph nodes Endocrine ROS: negative for - galactorrhea, hair pattern changes, polydipsia/polyuria or temperature intolerance Respiratory ROS: negative for - cough, hemoptysis, shortness of breath or wheezing Cardiovascular ROS: as noted in HPI Gastrointestinal ROS: negative for - abdominal pain, diarrhea, hematemesis, nausea/vomiting or stool incontinence Genito-Urinary ROS: negative for - dysuria, hematuria, incontinence or urinary frequency/urgency Musculoskeletal ROS: negative for - joint swelling or muscular weakness Neurological ROS: as noted in HPI Dermatological ROS: negative for rash and skin lesion changes  Physical Examination: Blood pressure 137/84, pulse (!) 103, temperature 98.5 F (36.9 C), temperature source Oral, height 5\' 6"  (1.676 m), weight 74.8 kg (165 lb), last menstrual period 01/07/2018, SpO2 100 %.  HEENT-  Normocephalic, no lesions, without obvious abnormality.  Normal external eye and conjunctiva.  Normal TM's bilaterally.  Normal auditory canals and external ears. Normal external nose, mucus membranes and septum.  Normal pharynx. Cardiovascular- S1, S2 normal, pulses palpable throughout   Lungs- chest clear, no wheezing, rales, normal symmetric air entry Abdomen- soft, non-tender; bowel sounds normal; no masses,  no  organomegaly Extremities- no edema Lymph-no adenopathy palpable Musculoskeletal-no joint tenderness, deformity or swelling Skin-warm and dry, no hyperpigmentation, vitiligo, or suspicious lesions  Neurological Examination   Mental Status: Alert, oriented, thought content appropriate.  Patient tearful and very anxious.  Speech fluent without evidence of aphasia.  Able to follow 3 step commands without difficulty. Cranial Nerves: II: Discs flat bilaterally; Visual fields grossly normal, pupils equal, round, reactive to light and accommodation III,IV, VI: ptosis not present, extra-ocular motions intact bilaterally V,VII: smile symmetric, facial light touch sensation normal bilaterally VIII: hearing normal bilaterally IX,X: gag reflex present XI: bilateral shoulder shrug XII: midline tongue extension Motor: Right : Upper extremity   5/5    Left:     Upper extremity   3+/5  Lower extremity   5/5     Lower extremity   2-3/5 Exam appears effort dependent Sensory: Pinprick and light touch decreased in the left upper and lower extremities Deep Tendon Reflexes: 2+ and symmetric throughout Plantars: Right: downgoing   Left: downgoing Cerebellar: Normal finger-to-nose and normal heel-to-shin testing bilaterally Gait: not tested due to safety concerns    Laboratory Studies:  Basic Metabolic Panel: Recent Labs  Lab 03/08/18 1204  NA 137  K 3.7  CL 107  CO2 20*  GLUCOSE 102*  BUN 12  CREATININE 0.83  CALCIUM 9.0    Liver Function Tests: Recent Labs  Lab 03/08/18 1204  AST 35  ALT 17  ALKPHOS 53  BILITOT 1.3*  PROT 7.1  ALBUMIN 4.0   No results for input(s): LIPASE, AMYLASE in the last 168 hours. No results for input(s): AMMONIA in the last 168 hours.  CBC: Recent Labs  Lab 03/08/18 1204  WBC 7.4  NEUTROABS 5.0  HGB 13.7  HCT 39.8  MCV 91.6  PLT 289    Cardiac Enzymes: Recent Labs  Lab 03/08/18 1204  TROPONINI <0.03    BNP: Invalid input(s):  POCBNP  CBG: Recent Labs  Lab 03/08/18 1207  GLUCAP 88    Microbiology: No results found for this or any previous visit.  Coagulation Studies: Recent Labs    03/08/18 1204  LABPROT 13.2  INR 1.01    Urinalysis: No results for input(s): COLORURINE, LABSPEC, PHURINE, GLUCOSEU, HGBUR, BILIRUBINUR, KETONESUR, PROTEINUR, UROBILINOGEN, NITRITE, LEUKOCYTESUR in the last 168 hours.  Invalid input(s): APPERANCEUR  Lipid Panel:    Component Value Date/Time   CHOL 192 09/19/2013 1223   TRIG 68.0 09/19/2013 1223   HDL 63.00 09/19/2013 1223   CHOLHDL 3 09/19/2013 1223   VLDL 13.6 09/19/2013 1223   LDLCALC 115 (H) 09/19/2013 1223    HgbA1C: No results found for: HGBA1C  Urine Drug Screen:  No results found for: LABOPIA, COCAINSCRNUR, LABBENZ, AMPHETMU, THCU, LABBARB  Alcohol Level: No results for input(s): ETH in the last 168 hours.  Other results: EKG: sinus tachycardia at 114 bpm  Imaging: Ct Head Code Stroke Wo Contrast  Addendum Date: 03/08/2018   ADDENDUM REPORT: 03/08/2018 12:49 ADDENDUM: Study discussed by telephone with Dr. Lennette Bihari PADUCHOWSKI on 03/08/2018 at 1229 hours. Electronically Signed   By: Genevie Ann M.D.   On: 03/08/2018 12:49   Result Date: 03/08/2018 CLINICAL DATA:  Code stroke. 47 year old female with left side headache, extremity pain, nausea. EXAM: CT HEAD WITHOUT CONTRAST TECHNIQUE: Contiguous axial images were obtained from the base of the skull through the vertex without intravenous contrast. COMPARISON:  Report of brain MRI 07/01/1997 (no images available). FINDINGS: Brain: Cerebral volume is within normal limits. No midline shift, ventriculomegaly, mass effect, evidence of mass lesion, intracranial hemorrhage or evidence of cortically based acute infarction. Gray-white matter differentiation is within normal limits throughout the brain. Inferior left basal ganglia perivascular space suspected (normal variant). Vascular: Calcified atherosclerosis at the skull  base, including the left supraclinoid ICA series 5, image 14). No suspicious intracranial vascular hyperdensity. Skull: Negative. Sinuses/Orbits: Clear.  Tympanic cavities and mastoids are clear. Other: Visualized scalp soft tissues are within normal limits. Visualized orbit soft tissues are within normal limits. Negative visible noncontrast deep soft tissue spaces of the face. ASPECTS [facility] Stroke Program Early CT Score) - Ganglionic level infarction (caudate, lentiform nuclei, internal capsule, insula, M1-M3 cortex): 7 - Supraganglionic infarction (M4-M6 cortex): 3 Total score (0-10 with 10 being normal): 10 IMPRESSION: 1.  Normal noncontrast CT appearance of the brain.  ASPECTS is 10. 2. Calcified atherosclerosis at the skull base appears advanced for age. Electronically Signed: By: Genevie Ann M.D. On: 03/08/2018 12:26    Assessment: 47 y.o. female presenting with headache and left hemiparesis.  Has a diagnosis and trigeminal neuralgia for which she has not accepted treatment.  Reports that she has occasional problems with her left side that have been similar to this.  Has history of left sided headaches as well.  Chest pain is new for her.  Option of tPA offered to patient but she is not comfortable with the risk profile at this time since this has improved in the past.  Will continue to evaluate for neurological change but had shown initial improvement in the ED.    Stroke Risk Factors - none  Plan: 1. HgbA1c, fasting lipid panel 2. CT angio of the chest 3. MRI of the brain without contrast.  If no evidence of acute ischemic injury and patient has returned to baseline, patient may be released on an ASA daily and be followed up on an outpatient basis.   4. NPO until RN stroke swallow screen 5. Telemetry monitoring 6. Frequent neuro checks  Case discussed with Dr. Percell Belt, MD Neurology [phone] 03/08/2018, 1:09 PM

## 2018-03-11 LAB — URINE CULTURE

## 2018-03-13 ENCOUNTER — Telehealth: Payer: Self-pay

## 2018-03-13 NOTE — Telephone Encounter (Signed)
Pt called back and made appt for tomorrow

## 2018-03-13 NOTE — Telephone Encounter (Signed)
Left message to call pt to see how she was doing after her Neurology appt. Advised her she needs a OV with Dr Silvio Pate. There is an opening we could use tomorrow (Thursday) for 15 mins if she calls back.

## 2018-03-14 ENCOUNTER — Ambulatory Visit (INDEPENDENT_AMBULATORY_CARE_PROVIDER_SITE_OTHER): Payer: 59 | Admitting: Internal Medicine

## 2018-03-14 ENCOUNTER — Encounter: Payer: Self-pay | Admitting: Internal Medicine

## 2018-03-14 VITALS — BP 122/80 | HR 98 | Temp 98.1°F | Ht 66.0 in | Wt 166.0 lb

## 2018-03-14 DIAGNOSIS — G43009 Migraine without aura, not intractable, without status migrainosus: Secondary | ICD-10-CM | POA: Diagnosis not present

## 2018-03-14 MED ORDER — SUMATRIPTAN SUCCINATE 50 MG PO TABS: 50.0000 mg | ORAL_TABLET | Freq: Once | ORAL | 2 refills | Status: DC

## 2018-03-14 NOTE — Assessment & Plan Note (Signed)
Likely perimenopausal Had associated symptoms that prompted extensive ER evaluation (reviewed)--but nothing worrisome Will try sumatriptan if recurs Consider headache specialist referral

## 2018-03-14 NOTE — Progress Notes (Signed)
Subjective:    Patient ID: Erin Fuentes, female    DOB: 02-07-1971, 47 y.o.   MRN: [id number]  HPI Here for ER follow up Had scary event--- some left sided headache-- in temple "Not typical headache" about once a month No OTC meds help Throbbing sensation Resolves on its own after 8-12 hours No vision changes Goes back at least 6 months Gets nausea, sweats at times Some photophobia and sonophobia  The day of the ER visit Also was having some sharp shooting right lower abdomen pain Then dull ache in left low side Then the headache came on Felt bad walking Nausea, ready to pass out----called 911 Felt weak on the left side Left finger tingling Did have slight chest pain as well  ER evaluation reviewed Head MRI normal CT/angio of chest was normal Finally decided that she wasn't having stroke Sent home  Headache gone now No chest pain Breathing is normal now  Current Outpatient Medications on File Prior to Visit  Medication Sig Dispense Refill  . cephALEXin (KEFLEX) 500 MG capsule Take 1 capsule (500 mg total) by mouth 4 (four) times daily for 10 days. 40 capsule 0  . cyanocobalamin 1000 MCG tablet Take 1,000 mcg by mouth every other day.    . hydrocortisone 2.5 % cream Apply topically 3 (three) times daily as needed. 28 g 3  . MELATONIN PO Take by mouth at bedtime as needed (sleep).    . Multiple Vitamin (MULTIVITAMIN) tablet Take 1 tablet by mouth daily.     No current facility-administered medications on file prior to visit.     Allergies  Allergen Reactions  . Erythromycin Base Diarrhea    Past Medical History:  Diagnosis Date  . MVP (mitral valve prolapse)   . Vitamin B12 deficiency    oral therapy effective    Past Surgical History:  Procedure Laterality Date  . APPENDECTOMY  2011  . [place]   x 2  . FEMORAL HERNIA REPAIR  ~1978   left ?    Family History  Problem Relation Age of Onset  . Heart disease Maternal  Grandmother        died of sudden MI  . Diabetes Other        very prevalent  . Diabetes Paternal Uncle   . Cancer Neg Hx     Social History   Socioeconomic History  . Marital status: Single    Spouse name: Not on file  . Number of children: 2  . Years of education: Not on file  . Highest education level: Not on file  Occupational History  . Occupation: Contractor in [place]  . Financial resource strain: Not on file  . Food insecurity:    Worry: Not on file    Inability: Not on file  . Transportation needs:    Medical: Not on file    Non-medical: Not on file  Tobacco Use  . Smoking status: Never Smoker  . Smokeless tobacco: Never Used  Substance and Sexual Activity  . Alcohol use: No  . Drug use: No  . Sexual activity: Not on file  Lifestyle  . Physical activity:    Days per week: Not on file    Minutes per session: Not on file  . Stress: Not on file  Relationships  . Social connections:    Talks on phone: Not on file    Gets together: Not on file  Attends religious service: Not on file    Active member of club or organization: Not on file    Attends meetings of clubs or organizations: Not on file    Relationship status: Not on file  . Intimate partner violence:    Fear of current or ex partner: Not on file    Emotionally abused: Not on file    Physically abused: Not on file    Forced sexual activity: Not on file  Other Topics Concern  . Not on file  Social History Narrative  . Not on file   Review of Systems Periods are irregular--she has thought that symptoms may be hormonal On antibiotic---not clear why (urine?) Not sleeping great--uses melatonin at times Drinking more water---staying away from caffeine    Objective:   Physical Exam  Constitutional: She is oriented to person, place, and time. She appears well-developed. No distress.  HENT:  Mouth/Throat: Oropharynx is clear and moist. No oropharyngeal  exudate.  Neck: No thyromegaly present.  Cardiovascular: Normal rate, regular rhythm and normal heart sounds. Exam reveals no gallop.  No murmur heard. Respiratory: Effort normal and breath sounds normal. No respiratory distress. She has no wheezes. She has no rales.  Musculoskeletal: She exhibits no edema or tenderness.  Lymphadenopathy:    She has no cervical adenopathy.  Neurological: She is oriented to person, place, and time. She has normal strength. She displays no tremor. No cranial nerve deficit. She exhibits normal muscle tone. She displays a negative Romberg sign. Coordination and gait normal.  Psychiatric: She has a normal mood and affect. Her behavior is normal.           Assessment & Plan:

## 2018-04-02 ENCOUNTER — Ambulatory Visit: Payer: 59 | Admitting: Internal Medicine

## 2018-05-30 ENCOUNTER — Encounter: Payer: 59 | Admitting: Internal Medicine

## 2018-10-07 ENCOUNTER — Ambulatory Visit: Payer: Self-pay | Admitting: Physician Assistant

## 2018-10-07 ENCOUNTER — Encounter: Payer: Self-pay | Admitting: Physician Assistant

## 2018-10-07 VITALS — BP 110/80 | HR 100 | Temp 98.5°F | Resp 12 | Wt 168.0 lb

## 2018-10-07 DIAGNOSIS — H6691 Otitis media, unspecified, right ear: Secondary | ICD-10-CM

## 2018-10-07 DIAGNOSIS — H60501 Unspecified acute noninfective otitis externa, right ear: Secondary | ICD-10-CM

## 2018-10-07 DIAGNOSIS — J302 Other seasonal allergic rhinitis: Secondary | ICD-10-CM

## 2018-10-07 MED ORDER — LORATADINE-PSEUDOEPHEDRINE ER 5-120 MG PO TB12: 1.0000 | ORAL_TABLET | Freq: Two times a day (BID) | ORAL | 0 refills | Status: AC

## 2018-10-07 MED ORDER — CIPROFLOXACIN-DEXAMETHASONE 0.3-0.1 % OT SUSP
4.0000 [drp] | Freq: Two times a day (BID) | OTIC | 0 refills | Status: AC
Start: 2018-10-07 — End: 2018-10-14

## 2018-10-07 MED ORDER — FLUTICASONE PROPIONATE 50 MCG/ACT NA SUSP: 2.0000 | Freq: Every day | NASAL | 0 refills | Status: DC

## 2018-10-07 MED ORDER — AMOXICILLIN-POT CLAVULANATE 875-125 MG PO TABS
1.0000 | ORAL_TABLET | Freq: Two times a day (BID) | ORAL | 0 refills | Status: AC
Start: 2018-10-07 — End: 2018-10-14

## 2018-10-07 NOTE — Patient Instructions (Signed)
Thank you for choosing [facility] for your health care needs.  You have been diagnosed with otitis externa (swimmer's ear) and otitis media (inner ear infection).  You have been prescribed an antibiotic, Augmentin. Take 1 pill twice a day x 7 days. Take with food to prevent stomach upset. Take an over the counter probiotic or eat yogurt while on antibiotic to help prevent stomach upset.  You have also been prescribed an antibiotic and steroid ear drop, CiproDex. Use 4 drops in right ear (may also use in both ears), twice a day x 7 days.  You have also been prescribed an antihistamine/decongestant, Claritin-D. And a steroid nasal spray, Flonase. May help with seasonal allergy symptoms.  Return to [place], or follow-up with urgent or family physician, if not feeling better in 2-3 days. Follow-up sooner if symptoms have worsened.  Otitis Externa  Otitis externa is an infection of the outer ear canal. The outer ear canal is the area between the outside of the ear and the eardrum. Otitis externa is sometimes called swimmer's ear. What are the causes? Common causes of this condition include:  Swimming in dirty water.  Moisture in the ear.  An injury to the inside of the ear.  An object stuck in the ear.  A cut or scrape on the outside of the ear. What increases the risk? You are more likely to get this condition if you go swimming often. What are the signs or symptoms?  Itching in the ear. This is often the first symptom.  Swelling of the ear.  Redness in the ear.  Ear pain. The pain may get worse when you pull on your ear.  Pus coming from the ear. How is this treated? This condition may be treated with:  Antibiotic ear drops. These are often given for 10-14 days.  Medicines to reduce itching and swelling. Follow these instructions at home:  If you were given antibiotic ear drops, use them as told by your doctor. Do not stop using them even if your condition gets  better.  Take over-the-counter and prescription medicines only as told by your doctor.  Avoid getting water in your ears as told by your doctor. You may be told to avoid swimming or water sports for a few days.  Keep all follow-up visits as told by your doctor. This is important. How is this prevented?  Keep your ears dry. Use the corner of a towel to dry your ears after you swim or bathe.  Try not to scratch or put things in your ear. Doing these things makes it easier for germs to grow in your ear.  Avoid swimming in lakes, dirty water, or pools that may not have the right amount of a chemical called chlorine. Contact a doctor if:  You have a fever.  Your ear is still red, swollen, or painful after 3 days.  You still have pus coming from your ear after 3 days.  Your redness, swelling, or pain gets worse.  You have a really bad headache.  You have redness, swelling, pain, or tenderness behind your ear. Summary  Otitis externa is an infection of the outer ear canal.  Symptoms include pain, redness, and swelling of the ear.  If you were given antibiotic ear drops, use them as told by your doctor. Do not stop using them even if your condition gets better.  Try not to scratch or put things in your ear. This information is not intended to replace advice given to you  by your health care provider. Make sure you discuss any questions you have with your health care provider. Document Released: 03/13/2008 Document Revised: 03/01/2018 Document Reviewed: 03/01/2018 Elsevier Interactive Patient Education  2019 [employer].

## 2018-10-07 NOTE — Progress Notes (Signed)
Patient ID: Erin Fuentes DOB: 03-Sep-1971 AGE: 48 y.o. MRN: [id number]   PCP: Venia Carbon, MD   Chief Complaint:  Chief Complaint  Patient presents with  . right ear pain    x3days     Subjective:    HPI:  Erin Fuentes is a 47 y.o. female presents for evaluation  Chief Complaint  Patient presents with  . right ear pain    x66days   47 year old female presents to [facility] with 4 day history of right ear pain. Began as mild. Gradually worsening. Reports pain at canal, tragus, and along right side of neck. Patient states yesterday began developing similar symptoms in left ear; however, right ear more severe. Associated pruritis, burning pain, and pressure/fullness sensation. Has used leftover CiproDex ear drops (two doses, however drops are expired) with minimal relief. Associated months of nasal congestion, bilateral maxillary sinus pressure/congestion, and sneezing. Patient has applied salt water in right ear canal and used warm compress with no improvement. Denies fever, chills, headache, ear discharge/drainage, sore throat, cough, chest pain, SOB, wheezing, nausea/vomiting. Patient denies regular use of Q-tips. Denies recent swimming. Denies use of ear pods, ear plugs, or hearing aides. No DM history.  Patient had episode of left otitis media and externa in August 2018. Originally seen at [facility] urgent care. Diagnosed with AOM. Prescribed oral amoxicillin. Patient followed-up at ED for continued left ear pressure/fullness. Diagnosed with otitis externa. Prescribed CiproDex ear drops. Patient then followed-up with PCP for continued discomfort. Prescribed 3-day course of prednisone 40mg .  A limited review of symptoms was performed, pertinent positives and negatives as mentioned in HPI.  The following portions of the patient's history were reviewed and updated as appropriate: allergies, current medications and past medical history.  Patient Active Problem  List   Diagnosis Date Noted  . Migraine headache without aura 03/14/2018  . Rash of face 10/16/2017  . Fatigue 10/16/2017  . Otitis externa 05/30/2017  . Arthralgia 02/03/2016  . Perimenopausal disorder 11/04/2015  . Facial pain, atypical 01/14/2015  . Routine general medical examination at a health care facility 02/29/2012  . Vitamin B12 deficiency   . MITRAL VALVE PROLAPSE 08/09/2010    Allergies  Allergen Reactions  . Erythromycin Base Diarrhea    Current Outpatient Medications on File Prior to Visit  Medication Sig Dispense Refill  . cyanocobalamin 1000 MCG tablet Take 1,000 mcg by mouth every other day.    . hydrocortisone 2.5 % cream Apply topically 3 (three) times daily as needed. 28 g 3  . MELATONIN PO Take by mouth at bedtime as needed (sleep).    . Multiple Vitamin (MULTIVITAMIN) tablet Take 1 tablet by mouth daily.    . SUMAtriptan (IMITREX) 50 MG tablet Take 1 tablet (50 mg total) by mouth once for 1 dose. May repeat in 2 hours if headache persists or recurs. 10 tablet 2   No current facility-administered medications on file prior to visit.        Objective:   Vitals:   10/07/18 0939  BP: 110/80  Pulse: 100  Resp: 12  Temp: 98.5 F (36.9 C)  SpO2: 98%     Wt Readings from Last 3 Encounters:  10/07/18 168 lb (76.2 kg)  03/14/18 166 lb (75.3 kg)  03/08/18 165 lb (74.8 kg)    Physical Exam:   General Appearance:  Patient sitting comfortably on examination table. Conversational. Kermit Balo self-historian. In no acute distress. Afebrile.   Head:  Normocephalic, without obvious abnormality,  atraumatic  Eyes:  PERRL, conjunctiva/corneas clear, EOM's intact  Ears:  Bilateral ear canals with dry/flaky skin. Left ear canal with no erythema or edema. No purulent drainage. Right ear canal with circumferential edema and faint erythema. Moist appearance; no specific purulent drainage. No fungal spores. Right TM with dull appearance, bulging and injection. Left TM WNL.  Mild tenderness with palpation over tight tragus and with insertion of otoscope. Mild discomfort with movement of auricle. No tenderness with palpation over right mastoid.  Nose: Nares normal, septum midline. Nasal mucosa with bilateral edema and scant clear rhinorrhea. Nasally/congested sounding voice. No sinus tenderness with percussion/palpation.  Throat: Lips, mucosa, and tongue normal; teeth and gums normal. Throat reveals no erythema. No visible cobblestoning. Tonsils with no enlargement or exudate. No trismus.  Neck: Supple, symmetrical, trachea midline, no lymphadenopathy  Lungs:   Clear to auscultation bilaterally, respirations unlabored  Heart:  Regular rate and rhythm, S1 and S2 normal, no murmur, rub, or gallop  Extremities: Extremities normal, atraumatic, no cyanosis or edema  Pulses: 2+ and symmetric  Skin: Skin color, texture, turgor normal, no rashes or lesions  Lymph nodes: Cervical, supraclavicular, and axillary nodes normal  Neurologic: Normal    Assessment & Plan:    Exam findings, diagnosis etiology and medication use and indications reviewed with patient. Follow-Up and discharge instructions provided. No emergent/urgent issues found on exam.  Patient education was provided.   Patient verbalized understanding of information provided and agrees with plan of care (POC), all questions answered. The patient is advised to call or return to clinic if condition does not see an improvement in symptoms, or to seek the care of the closest emergency department if condition worsens with the below plan.    1. Acute otitis externa of right ear, unspecified type  - ciprofloxacin-dexamethasone (CIPRODEX) OTIC suspension; Place 4 drops into both ears 2 (two) times daily for 7 days.  Dispense: 7.5 mL; Refill: 0  2. Acute right otitis media  - amoxicillin-clavulanate (AUGMENTIN) 875-125 MG tablet; Take 1 tablet by mouth 2 (two) times daily for 7 days.  Dispense: 14 tablet; Refill:  0  3. Seasonal allergies  - loratadine-pseudoephedrine (CLARITIN-D 12 HOUR) 5-120 MG tablet; Take 1 tablet by mouth 2 (two) times daily for 7 days.  Dispense: 14 tablet; Refill: 0 - fluticasone (FLONASE) 50 MCG/ACT nasal spray; Place 2 sprays into both nostrils daily.  Dispense: 16 g; Refill: 0   Patient with four day history of right ear discomfort. On physical examination; patient with right otitis externa and right otitis media. Prescribed CiproDex ear drops and oral Augmentin (will cover otitis media and long standing sinus symptoms). Patient also prescribed Claritin-D and Flonase for suspected concomitant allergy symptoms. Advised patient follow-up with Leodis Binet, family physician, or urgent care in 2-3 days if not better. Sooner with any worsening symptoms.  Also, in regards to recurrent episode of right otitis externa/media, advise patient follow-up with ENT for further evaluation and possible preventative measures.   Darlin Priestly, MHS, PA-C Montey Hora, MHS, PA-C Advanced Practice Provider [facility]  [address] [facility], [place], [place] [address] (p):  [phone] [email or web address]

## 2018-10-10 ENCOUNTER — Telehealth: Payer: Self-pay | Admitting: Emergency Medicine

## 2018-10-10 NOTE — Telephone Encounter (Signed)
Left message follow up call from visit with [facility]. 

## 2018-10-16 ENCOUNTER — Ambulatory Visit (INDEPENDENT_AMBULATORY_CARE_PROVIDER_SITE_OTHER): Payer: 59 | Admitting: Internal Medicine

## 2018-10-16 ENCOUNTER — Encounter: Payer: Self-pay | Admitting: Internal Medicine

## 2018-10-16 DIAGNOSIS — G43009 Migraine without aura, not intractable, without status migrainosus: Secondary | ICD-10-CM

## 2018-10-16 DIAGNOSIS — Z0000 Encounter for general adult medical examination without abnormal findings: Secondary | ICD-10-CM

## 2018-10-16 MED ORDER — HYDROCORTISONE 2.5 % EX CREA: TOPICAL_CREAM | Freq: Three times a day (TID) | CUTANEOUS | 3 refills | Status: DC | PRN

## 2018-10-16 MED ORDER — SUMATRIPTAN SUCCINATE 50 MG PO TABS: 50.0000 mg | ORAL_TABLET | Freq: Once | ORAL | 11 refills | Status: DC

## 2018-10-16 NOTE — Patient Instructions (Addendum)
Please set up your screening mammogram if you decide to do this.   DASH Eating Plan DASH stands for "Dietary Approaches to Stop Hypertension." The DASH eating plan is a healthy eating plan that has been shown to reduce high blood pressure (hypertension). It may also reduce your risk for type 2 diabetes, heart disease, and stroke. The DASH eating plan may also help with weight loss. What are tips for following this plan?  General guidelines  Avoid eating more than 2,300 mg (milligrams) of salt (sodium) a day. If you have hypertension, you may need to reduce your sodium intake to 1,500 mg a day.  Limit alcohol intake to no more than 1 drink a day for nonpregnant women and 2 drinks a day for men. One drink equals 12 oz of beer, 5 oz of wine, or 1 oz of hard liquor.  Work with your health care provider to maintain a healthy body weight or to lose weight. Ask what an ideal weight is for you.  Get at least 30 minutes of exercise that causes your heart to beat faster (aerobic exercise) most days of the week. Activities may include walking, swimming, or biking.  Work with your health care provider or diet and nutrition specialist (dietitian) to adjust your eating plan to your individual calorie needs. Reading food labels   Check food labels for the amount of sodium per serving. Choose foods with less than 5 percent of the Daily Value of sodium. Generally, foods with less than 300 mg of sodium per serving fit into this eating plan.  To find whole grains, look for the word "whole" as the first word in the ingredient list. Shopping  Buy products labeled as "low-sodium" or "no salt added."  Buy fresh foods. Avoid canned foods and premade or frozen meals. Cooking  Avoid adding salt when cooking. Use salt-free seasonings or herbs instead of table salt or sea salt. Check with your health care provider or pharmacist before using salt substitutes.  Do not fry foods. Cook foods using healthy methods  such as baking, boiling, grilling, and broiling instead.  Cook with heart-healthy oils, such as olive, canola, soybean, or sunflower oil. Meal planning  Eat a balanced diet that includes: ? 5 or more servings of fruits and vegetables each day. At each meal, try to fill half of your plate with fruits and vegetables. ? Up to 6-8 servings of whole grains each day. ? Less than 6 oz of lean meat, poultry, or fish each day. A 3-oz serving of meat is about the same size as a deck of cards. One egg equals 1 oz. ? 2 servings of low-fat dairy each day. ? A serving of nuts, seeds, or beans 5 times each week. ? Heart-healthy fats. Healthy fats called Omega-3 fatty acids are found in foods such as flaxseeds and coldwater fish, like sardines, salmon, and mackerel.  Limit how much you eat of the following: ? Canned or prepackaged foods. ? Food that is high in trans fat, such as fried foods. ? Food that is high in saturated fat, such as fatty meat. ? Sweets, desserts, sugary drinks, and other foods with added sugar. ? Full-fat dairy products.  Do not salt foods before eating.  Try to eat at least 2 vegetarian meals each week.  Eat more home-cooked food and less restaurant, buffet, and fast food.  When eating at a restaurant, ask that your food be prepared with less salt or no salt, if possible. What foods are recommended?  The items listed may not be a complete list. Talk with your dietitian about what dietary choices are best for you. Grains Whole-grain or whole-wheat bread. Whole-grain or whole-wheat pasta. Brown rice. Modena Morrow. Bulgur. Whole-grain and low-sodium cereals. Pita bread. Low-fat, low-sodium crackers. Whole-wheat flour tortillas. Vegetables Fresh or frozen vegetables (raw, steamed, roasted, or grilled). Low-sodium or reduced-sodium tomato and vegetable juice. Low-sodium or reduced-sodium tomato sauce and tomato paste. Low-sodium or reduced-sodium canned vegetables. Fruits All  fresh, dried, or frozen fruit. Canned fruit in natural juice (without added sugar). Meat and other protein foods Skinless chicken or [place]. Ground chicken or [place]. Pork with fat trimmed off. Fish and seafood. Egg whites. Dried beans, peas, or lentils. Unsalted nuts, nut butters, and seeds. Unsalted canned beans. Lean cuts of beef with fat trimmed off. Low-sodium, lean deli meat. Dairy Low-fat (1%) or fat-free (skim) milk. Fat-free, low-fat, or reduced-fat cheeses. Nonfat, low-sodium ricotta or cottage cheese. Low-fat or nonfat yogurt. Low-fat, low-sodium cheese. Fats and oils Soft margarine without trans fats. Vegetable oil. Low-fat, reduced-fat, or light mayonnaise and salad dressings (reduced-sodium). Canola, safflower, olive, soybean, and sunflower oils. Avocado. Seasoning and other foods Herbs. Spices. Seasoning mixes without salt. Unsalted popcorn and pretzels. Fat-free sweets. What foods are not recommended? The items listed may not be a complete list. Talk with your dietitian about what dietary choices are best for you. Grains Baked goods made with fat, such as croissants, muffins, or some breads. Dry pasta or rice meal packs. Vegetables Creamed or fried vegetables. Vegetables in a cheese sauce. Regular canned vegetables (not low-sodium or reduced-sodium). Regular canned tomato sauce and paste (not low-sodium or reduced-sodium). Regular tomato and vegetable juice (not low-sodium or reduced-sodium). Angie Fava. Olives. Fruits Canned fruit in a light or heavy syrup. Fried fruit. Fruit in cream or butter sauce. Meat and other protein foods Fatty cuts of meat. Ribs. Fried meat. Berniece Salines. Sausage. Bologna and other processed lunch meats. Salami. Fatback. Hotdogs. Bratwurst. Salted nuts and seeds. Canned beans with added salt. Canned or smoked fish. Whole eggs or egg yolks. Chicken or [place] with skin. Dairy Whole or 2% milk, cream, and half-and-half. Whole or full-fat cream cheese. Whole-fat or  sweetened yogurt. Full-fat cheese. Nondairy creamers. Whipped toppings. Processed cheese and cheese spreads. Fats and oils Butter. Stick margarine. Lard. Shortening. Ghee. Bacon fat. Tropical oils, such as coconut, palm kernel, or palm oil. Seasoning and other foods Salted popcorn and pretzels. Onion salt, garlic salt, seasoned salt, table salt, and sea salt. Worcestershire sauce. Tartar sauce. Barbecue sauce. Teriyaki sauce. Soy sauce, including reduced-sodium. Steak sauce. Canned and packaged gravies. Fish sauce. Oyster sauce. Cocktail sauce. Horseradish that you find on the shelf. Ketchup. Mustard. Meat flavorings and tenderizers. Bouillon cubes. Hot sauce and Tabasco sauce. Premade or packaged marinades. Premade or packaged taco seasonings. Relishes. Regular salad dressings. Where to find more information:  National Heart, Lung, and [place]: https://[place].com/  American Heart Association: www.heart.org Summary  The DASH eating plan is a healthy eating plan that has been shown to reduce high blood pressure (hypertension). It may also reduce your risk for type 2 diabetes, heart disease, and stroke.  With the DASH eating plan, you should limit salt (sodium) intake to 2,300 mg a day. If you have hypertension, you may need to reduce your sodium intake to 1,500 mg a day.  When on the DASH eating plan, aim to eat more fresh fruits and vegetables, whole grains, lean proteins, low-fat dairy, and heart-healthy fats.  Work with your health care  provider or diet and nutrition specialist (dietitian) to adjust your eating plan to your individual calorie needs. This information is not intended to replace advice given to you by your health care provider. Make sure you discuss any questions you have with your health care provider. Document Released: 09/14/2011 Document Revised: 09/18/2016 Document Reviewed: 09/18/2016 Elsevier Interactive Patient Education  2019 [employer].

## 2018-10-16 NOTE — Progress Notes (Signed)
Subjective:    Patient ID: Erin Fuentes, female    DOB: [birth year], 48 y.o.   MRN: [id number]  HPI Here for physical Had some swollen glands and congested ears Seen at [facility] the antibiotics---still has popping  Still gets occasional headaches imitrex does help abort  Current Outpatient Medications on File Prior to Visit  Medication Sig Dispense Refill  . cyanocobalamin 1000 MCG tablet Take 1,000 mcg by mouth every other day.    . hydrocortisone 2.5 % cream Apply topically 3 (three) times daily as needed. 28 g 3  . MELATONIN PO Take by mouth at bedtime as needed (sleep).    . Multiple Vitamin (MULTIVITAMIN) tablet Take 1 tablet by mouth daily.    . SUMAtriptan (IMITREX) 50 MG tablet Take 1 tablet (50 mg total) by mouth once for 1 dose. May repeat in 2 hours if headache persists or recurs. 10 tablet 2   No current facility-administered medications on file prior to visit.     Allergies  Allergen Reactions  . Erythromycin Base Diarrhea    Past Medical History:  Diagnosis Date  . MVP (mitral valve prolapse)   . Vitamin B12 deficiency    oral therapy effective    Past Surgical History:  Procedure Laterality Date  . APPENDECTOMY  2011  . [place]   x 2  . FEMORAL HERNIA REPAIR  ~1978   left ?    Family History  Problem Relation Age of Onset  . Heart disease Maternal Grandmother        died of sudden MI  . Diabetes Other        very prevalent  . Diabetes Paternal Uncle   . Cancer Neg Hx     Social History   Socioeconomic History  . Marital status: Single    Spouse name: Not on file  . Number of children: 2  . Years of education: Not on file  . Highest education level: Not on file  Occupational History  . Occupation: Contractor in [employer]  . Financial resource strain: Not on file  . Food insecurity:    Worry: Not on file    Inability: Not on file  . Transportation needs:   Medical: Not on file    Non-medical: Not on file  Tobacco Use  . Smoking status: Never Smoker  . Smokeless tobacco: Never Used  Substance and Sexual Activity  . Alcohol use: No  . Drug use: No  . Sexual activity: Not on file  Lifestyle  . Physical activity:    Days per week: Not on file    Minutes per session: Not on file  . Stress: Not on file  Relationships  . Social connections:    Talks on phone: Not on file    Gets together: Not on file    Attends religious service: Not on file    Active member of club or organization: Not on file    Attends meetings of clubs or organizations: Not on file    Relationship status: Not on file  . Intimate partner violence:    Fear of current or ex partner: Not on file    Emotionally abused: Not on file    Physically abused: Not on file    Forced sexual activity: Not on file  Other Topics Concern  . Not on file  Social History Narrative  . Not on file   Review of Systems  Constitutional:  Negative for unexpected weight change.       No exercise---discussed Gets tired at times Wears seat belt  HENT: Negative for hearing loss, tinnitus and trouble swallowing.        Keeps up with dentist  Eyes: Negative for visual disturbance.       No diplopia or unilateral vision loss  Respiratory: Negative for cough, chest tightness and shortness of breath.   Cardiovascular: Negative for chest pain, palpitations and leg swelling.  Gastrointestinal: Negative for blood in stool and constipation.       No heartburn   Endocrine: Negative for polydipsia and polyuria.  Genitourinary: Negative for dysuria and hematuria.       Periods regular--only 2-3 days per month No sex--no problem  Musculoskeletal: Negative for arthralgias, back pain and joint swelling.  Skin: Negative for rash.       Gets some red bumps---like on neck  Allergic/Immunologic: Negative for immunocompromised state.       Sneezes a lot  Neurological: Negative for dizziness, syncope,  light-headedness and headaches.  Hematological: Negative for adenopathy. Bruises/bleeds easily.  Psychiatric/Behavioral: Negative for dysphoric mood and sleep disturbance. The patient is not nervous/anxious.        Objective:   Physical Exam  Constitutional: She is oriented to person, place, and time. She appears well-developed. No distress.  HENT:  Head: Normocephalic and atraumatic.  Right Ear: External ear normal.  Left Ear: External ear normal.  Mouth/Throat: Oropharynx is clear and moist. No oropharyngeal exudate.  Eyes: Pupils are equal, round, and reactive to light. Conjunctivae are normal.  Neck: No thyromegaly present.  Cardiovascular: Normal rate, regular rhythm, normal heart sounds and intact distal pulses. Exam reveals no gallop.  No murmur heard. Respiratory: Effort normal and breath sounds normal. No respiratory distress. She has no wheezes. She has no rales.  GI: Soft. There is no abdominal tenderness.  Musculoskeletal:        General: No tenderness or edema.  Lymphadenopathy:    She has no cervical adenopathy.  Neurological: She is alert and oriented to person, place, and time.  Skin: No rash noted.  Very early varicosities Benign fleshy moles on neck  Psychiatric: She has a normal mood and affect. Her behavior is normal.           Assessment & Plan:

## 2018-10-16 NOTE — Assessment & Plan Note (Signed)
Healthy Pap due 2021 She is considering another mammogram Colon screening at 49 Discussed DASH and exercise

## 2018-10-16 NOTE — Assessment & Plan Note (Signed)
Sumatriptan does well to abort headaches

## 2019-03-12 ENCOUNTER — Ambulatory Visit (INDEPENDENT_AMBULATORY_CARE_PROVIDER_SITE_OTHER): Payer: 59 | Admitting: Internal Medicine

## 2019-03-12 ENCOUNTER — Other Ambulatory Visit: Payer: Self-pay

## 2019-03-12 ENCOUNTER — Encounter: Payer: Self-pay | Admitting: Internal Medicine

## 2019-03-12 VITALS — BP 110/80 | HR 82 | Temp 98.1°F | Ht 65.75 in | Wt 174.0 lb

## 2019-03-12 DIAGNOSIS — R1031 Right lower quadrant pain: Secondary | ICD-10-CM | POA: Diagnosis not present

## 2019-03-12 LAB — COMPREHENSIVE METABOLIC PANEL
ALT: 12 U/L (ref 0–35)
AST: 21 U/L (ref 0–37)
Albumin: 4.4 g/dL (ref 3.5–5.2)
Alkaline Phosphatase: 53 U/L (ref 39–117)
BUN: 11 mg/dL (ref 6–23)
CO2: 26 mEq/L (ref 19–32)
Calcium: 9.7 mg/dL (ref 8.4–10.5)
Chloride: 106 mEq/L (ref 96–112)
Creatinine, Ser: 0.87 mg/dL (ref 0.40–1.20)
GFR: 69.18 mL/min (ref >60.00)
Glucose, Bld: 88 mg/dL (ref 70–99)
Potassium: 4.1 mEq/L (ref 3.5–5.1)
Sodium: 139 mEq/L (ref 135–145)
Total Bilirubin: 0.8 mg/dL (ref 0.2–1.2)
Total Protein: 7.3 g/dL (ref 6.0–8.3)

## 2019-03-12 LAB — SEDIMENTATION RATE: Sed Rate: 13 mm/hr (ref 0–20)

## 2019-03-12 LAB — CBC
HCT: 38.7 % (ref 36.0–46.0)
Hemoglobin: 13.5 g/dL (ref 12.0–15.0)
MCHC: 34.8 g/dL (ref 30.0–36.0)
MCV: 92.2 fl (ref 78.0–100.0)
Platelets: 310 10*3/uL (ref 150.0–400.0)
RBC: 4.19 Mil/uL (ref 3.87–5.11)
RDW: 12.5 % (ref 11.5–15.5)
WBC: 6.5 10*3/uL (ref 4.0–10.5)

## 2019-03-12 LAB — LIPASE: Lipase: 15 U/L (ref 11.0–59.0)

## 2019-03-12 NOTE — Progress Notes (Signed)
Subjective:    Patient ID: Erin Fuentes, female    DOB: [birth year], 48 y.o.   MRN: [id number]  HPI Here due to abdominal pain  Having sharp, intermittent RLQ pain--1-2 weeks 3-4 times a day---will "take my breath" and then it eases Gets feeling down there, around to flank and then slightly higher Had appendix out--but wondered if it was related to her ovary advil and heating pad--may help some  No particular time of day Not activity related No clear relationship to eating Bowels have been regular--no blood  Has spell of vomiting about a month ago Didn't seem to be related to this  Current Outpatient Medications on File Prior to Visit  Medication Sig Dispense Refill  . cyanocobalamin 1000 MCG tablet Take 1,000 mcg by mouth every other day.    . hydrocortisone 2.5 % cream Apply topically 3 (three) times daily as needed. 28 g 3  . MELATONIN PO Take by mouth at bedtime as needed (sleep).    . Multiple Vitamin (MULTIVITAMIN) tablet Take 1 tablet by mouth daily.    . SUMAtriptan (IMITREX) 50 MG tablet Take 1 tablet (50 mg total) by mouth once for 1 dose. May repeat in 2 hours if headache persists or recurs. 10 tablet 11   No current facility-administered medications on file prior to visit.     Allergies  Allergen Reactions  . Erythromycin Base Diarrhea    Past Medical History:  Diagnosis Date  . MVP (mitral valve prolapse)   . Vitamin B12 deficiency    oral therapy effective    Past Surgical History:  Procedure Laterality Date  . APPENDECTOMY  2011  . [place]   x 2  . FEMORAL HERNIA REPAIR  ~1978   left ?    Family History  Problem Relation Age of Onset  . Heart disease Maternal Grandmother        died of sudden MI  . Diabetes Other        very prevalent  . Diabetes Paternal Uncle   . Cancer Neg Hx     Social History   Socioeconomic History  . Marital status: Single    Spouse name: Not on file  . Number of children: 2  . Years  of education: Not on file  . Highest education level: Not on file  Occupational History  . 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 Financial resource strain: Not on file  . Food insecurity:    Worry: Not on file    Inability: Not on file  . Transportation needs:    Medical: Not on file    Non-medical: Not on file  Tobacco Use  . Smoking status: Never Smoker  . Smokeless tobacco: Never Used  Substance and Sexual Activity  . Alcohol use: No  . Drug use: No  . Sexual activity: Not on file  Lifestyle  . Physical activity:    Days per week: Not on file    Minutes per session: Not on file  . Stress: Not on file  Relationships  . Social connections:    Talks on phone: Not on file    Gets together: Not on file    Attends religious service: Not on file    Active member of club or organization: Not on file    Attends meetings of clubs or organizations: Not on file    Relationship status: Not on file  . Intimate partner violence:  Fear of current or ex partner: Not on file    Emotionally abused: Not on file    Physically abused: Not on file    Forced sexual activity: Not on file  Other Topics Concern  . Not on file  Social History Narrative  . Not on file   Review of Systems Appetite is okay Weight stable No fever No cough or SOB Periods now lasting about 3 days---LMP almost a month ago (due soon) No dysuria or hematuria    Objective:   Physical Exam  Constitutional: She appears well-developed. No distress.  Neck: No thyromegaly present.  Cardiovascular: Normal rate, regular rhythm and normal heart sounds. Exam reveals no gallop.  No murmur heard. Respiratory: Effort normal and breath sounds normal. No respiratory distress. She has no wheezes. She has no rales.  GI: Soft. Bowel sounds are normal. She exhibits no distension and no mass. There is no rebound and no guarding.  Tenderness both in RUQ and RLQ  Musculoskeletal:         General: No edema.  Lymphadenopathy:    She has no cervical adenopathy.  Psychiatric: She has a normal mood and affect. Her behavior is normal.           Assessment & Plan:

## 2019-03-12 NOTE — Assessment & Plan Note (Signed)
Also tenderness at RUQ and soreness around upper lumbar right back Differential would be mainly ovarian, gallbladder or possibly partial adhesion. IBD very unlikely without other systemic symptoms Will check labs and ultrasound

## 2019-03-26 ENCOUNTER — Ambulatory Visit
Admission: RE | Admit: 2019-03-26 | Discharge: 2019-03-26 | Disposition: A | Discharge status: Home / Self Care | Payer: 59 | Source: Ambulatory Visit | Attending: Internal Medicine | Admitting: Internal Medicine

## 2019-03-26 DIAGNOSIS — R1031 Right lower quadrant pain: Secondary | ICD-10-CM

## 2019-08-21 ENCOUNTER — Other Ambulatory Visit: Payer: Self-pay

## 2019-08-21 ENCOUNTER — Ambulatory Visit: Payer: 59 | Admitting: Internal Medicine

## 2019-08-21 ENCOUNTER — Encounter: Payer: Self-pay | Admitting: Internal Medicine

## 2019-08-21 DIAGNOSIS — R109 Unspecified abdominal pain: Secondary | ICD-10-CM | POA: Insufficient documentation

## 2019-08-21 DIAGNOSIS — R10A2 Flank pain, left side: Secondary | ICD-10-CM | POA: Insufficient documentation

## 2019-08-21 LAB — POC URINALSYSI DIPSTICK (AUTOMATED)
Bilirubin, UA: NEGATIVE
Blood, UA: NEGATIVE
Glucose, UA: NEGATIVE
Ketones, UA: NEGATIVE
Leukocytes, UA: NEGATIVE
Nitrite, UA: NEGATIVE
Protein, UA: NEGATIVE
Spec Grav, UA: 1.015 (ref 1.010–1.025)
Urobilinogen, UA: 0.2 E.U./dL
pH, UA: 6 (ref 5.0–8.0)

## 2019-08-21 NOTE — Progress Notes (Signed)
Subjective:    Patient ID: Erin Fuentes, female    DOB: 02/26/1971, 48 y.o.   MRN: [id number]  HPI Here due to back pain  1 week of left low back pain Thought it might be a pulled muscle but no known injury She does move boxes regularly--tries to lift with her legs Comes and goes--- will get sharp pains upon getting up from sitting or turning a certain way.  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 cyanocobalamin 1000 MCG tablet Take 1,000 mcg by mouth every other day.    . hydrocortisone 2.5 % cream Apply topically 3 (three) times daily as needed. 28 g 3  . MELATONIN PO Take by mouth at bedtime as needed (sleep).    . Multiple Vitamin (MULTIVITAMIN) tablet Take 1 tablet by mouth daily.    . SUMAtriptan (IMITREX) 50 MG tablet Take 1 tablet (50 mg total) by mouth once for 1 dose. May repeat in 2 hours if headache persists or recurs. 10 tablet 11   No current facility-administered medications on file prior to visit.     Allergies  Allergen Reactions  . Erythromycin Base Diarrhea    Past Medical History:  Diagnosis Date  . MVP (mitral valve prolapse)   . Vitamin B12 deficiency    oral therapy effective    Past Surgical History:  Procedure Laterality Date  . APPENDECTOMY  2011  . [place]   x 2  . FEMORAL HERNIA REPAIR  ~1978   left ?    Family History  Problem Relation Age of Onset  . Heart disease Maternal Grandmother        died of sudden MI  . Diabetes Other        very prevalent  . Diabetes Paternal Uncle   . Cancer Neg Hx     Social History   Socioeconomic History  . Marital status: Single    Spouse name: Not on file  . Number  of children: 2  . Years of education: Not on file  . Highest education level: Not on file  Occupational History  . 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 Financial resource strain: Not on file  . Food insecurity    Worry: Not on file    Inability: Not on file  . Transportation needs    Medical: Not on file    Non-medical: Not on file  Tobacco Use  . Smoking status: Never Smoker  . Smokeless tobacco: Never Used  Substance and Sexual Activity  . Alcohol use: No  . Drug use: No  . Sexual activity: Not on file  Lifestyle  . Physical activity    Days per week: Not on file    Minutes per session: Not on file  . Stress: Not on file  Relationships  . Social Herbalist on phone: Not on file    Gets together: Not on file    Attends religious service: Not on file    Active member of club or organization: Not on file    Attends meetings of clubs or organizations: Not on file    Relationship status:  Not on file  . Intimate partner violence    Fear of current or ex partner: Not on file    Emotionally abused: Not on file    Physically abused: Not on file    Forced sexual activity: Not on file  Other Topics Concern  . Not on file  Social History Narrative  . Not on file   Review of Systems No dysuria but some increased frequency lately Some blood--but having her menses No fever Slight nausea but no vomiting Eating "so-so"     Objective:   Physical Exam  Constitutional: She appears well-developed. No distress.  Musculoskeletal:     Comments: Pain is in left CVA area No lower lumbar pain or spine tenderness Back flexion is fairly full SLR mildly painful on left, negative on right  Neurological:  Gait normal No leg weakness           Assessment & Plan:

## 2019-08-21 NOTE — Assessment & Plan Note (Addendum)
Seems muscular Certainly not consistent with kidney infection but could be stone (though no prior history of stones) Urinalysis is completely normal  Reassured this should resolve on its own Can try 3 advil at a time prn Heat/ice Careful lifting

## 2019-10-21 ENCOUNTER — Telehealth: Payer: Self-pay

## 2019-10-21 NOTE — Telephone Encounter (Signed)
[name] - Client Nonclinical Telephone Record AccessNurse Client [name] Physician Viviana Simpler - MD Contact Type Call Who Is Calling Patient / Member / Family / Caregiver Caller Name [name] Phone Number [phone] Patient Name Erin Fuentes Patient DOB 05-Nov-1970 Call Type Message Only Information Provided Reason for Call Request to Reschedule Office Appointment Initial Comment Caller states would like r/s appt. Additional Comment Declines triage. Disp. Time Disposition Final User 10/20/2019 5:53:31 PM General Information Provided Yes Scharlene Corn Call Closed By: Scharlene Corn Transaction Date/Time: 10/20/2019 5:51:48 PM (ET)

## 2019-10-21 NOTE — Telephone Encounter (Signed)
Patient called the office and rescheduled appointment to March.

## 2019-10-22 ENCOUNTER — Encounter: Payer: 59 | Admitting: Internal Medicine

## 2019-11-24 ENCOUNTER — Other Ambulatory Visit: Payer: Self-pay | Admitting: Internal Medicine

## 2019-11-27 ENCOUNTER — Telehealth: Payer: Self-pay

## 2019-11-27 NOTE — Telephone Encounter (Signed)
[facility] Night - Client TELEPHONE ADVICE RECORD AccessNurse Patient Name: Erin Fuentes Gender: Female DOB: 27-Oct-1970 Age: 49 Y 50 M 7 D Return Phone Number: [phone] (Primary), [phone] (Secondary) Address: City/State/Zip: [place] [address] Client [facility] Night - Client Client Site [place] Physician Viviana Simpler - MD Contact Type Call Who Is Calling Patient / Member / Family / Caregiver Call Type Triage / Clinical Relationship To Patient Self Return Phone Number [phone] (Primary) Chief Complaint Rectal Bleeding Reason for Call Symptomatic / Request for [place] states she felt like she had to have a bowel movement and is having bright red blood coming from her rectum. Translation No Nurse Assessment Nurse: Martyn Ehrich, RN, [place] Date/Time (Eastern Time): 11/26/2019 6:16:25 PM Confirm and document reason for call. If symptomatic, describe symptoms. ---Pt just now had a small BM with a lot of blood coming from rectum into the toilet just now. No fever Has the patient had close contact with a person known or suspected to have the novel coronavirus illness OR traveled / lives in area with major community spread (including international travel) in the last 14 days from the onset of symptoms? * If Asymptomatic, screen for exposure and travel within the last 14 days. ---No Does the patient have any new or worsening symptoms? ---Yes Will a triage be completed? ---Yes Related visit to physician within the last 2 weeks? ---No Does the PT have any chronic conditions? (i.e. diabetes, asthma, this includes High risk factors for pregnancy, etc.) ---No Is the patient pregnant or possibly pregnant? (Ask all females between the ages of 85-55) ---No Is this a behavioral health or substance abuse call? ---No Guidelines Guideline Title Affirmed Question  Affirmed Notes Nurse Date/Time (Eastern Time) Rectal Bleeding MODERATE rectal bleeding (small blood clots, passing blood without stool, or toilet water turns red) Martyn Ehrich, RN, [facility] [id number] PM Disp. Time Eilene Ghazi Time) Disposition Final User PLEASE NOTE: All timestamps contained within this report are represented as [place] Standard Time. CONFIDENTIALTY NOTICE: This fax transmission is intended only for the addressee. It contains information that is legally privileged, confidential or otherwise protected from use or disclosure. If you are not the intended recipient, you are strictly prohibited from reviewing, disclosing, copying using or disseminating any of this information or taking any action in reliance on or regarding this information. If you have received this fax in error, please notify us immediately by telephone so that we can arrange for its return to [place]. Phone: [phone], Toll-Free: [phone], Fax: [phone] Page: 2 of 2 Call Id: [id number] 11/26/2019 6:24:40 PM See PCP within 24 Hours Yes Martyn Ehrich, RN, [place] Caller Disagree/Comply Comply Caller Understands Yes PreDisposition Did not know what to do Care Advice Given Per Guideline SEE PCP WITHIN 24 HOURS: * Please bring a list of your current medicines when you go to see the doctor. * It is also a good idea to bring the pill bottles too. This will help the doctor to make certain you are taking the right medicines and the right dose. * Dizziness occurs * Bleeding increases * You become worse. CALL BACK IF: Referrals REFERRED TO PCP OFFICE

## 2019-11-27 NOTE — Telephone Encounter (Signed)
LVM that I will f/u later today to check on pt

## 2019-12-19 ENCOUNTER — Ambulatory Visit (INDEPENDENT_AMBULATORY_CARE_PROVIDER_SITE_OTHER): Payer: 59 | Admitting: Internal Medicine

## 2019-12-19 ENCOUNTER — Encounter: Payer: Self-pay | Admitting: Internal Medicine

## 2019-12-19 ENCOUNTER — Other Ambulatory Visit: Payer: Self-pay

## 2019-12-19 VITALS — BP 110/80 | HR 74 | Temp 97.8°F | Ht 65.5 in | Wt 164.0 lb

## 2019-12-19 DIAGNOSIS — Z1211 Encounter for screening for malignant neoplasm of colon: Secondary | ICD-10-CM | POA: Diagnosis not present

## 2019-12-19 DIAGNOSIS — Z23 Encounter for immunization: Secondary | ICD-10-CM | POA: Diagnosis not present

## 2019-12-19 DIAGNOSIS — Z0000 Encounter for general adult medical examination without abnormal findings: Secondary | ICD-10-CM

## 2019-12-19 DIAGNOSIS — G43009 Migraine without aura, not intractable, without status migrainosus: Secondary | ICD-10-CM | POA: Diagnosis not present

## 2019-12-19 NOTE — Assessment & Plan Note (Signed)
Healthy Discussed getting back to exercise Recommended COVID vaccine She still prefers no flu vaccine Mammogram this year Will set up colon for screening Pap due next year

## 2019-12-19 NOTE — Assessment & Plan Note (Signed)
Less common Sumatriptan still helpful

## 2019-12-19 NOTE — Addendum Note (Signed)
Addended by: Pilar Grammes on: 12/19/2019 12:59 PM   Modules accepted: Orders

## 2019-12-19 NOTE — Patient Instructions (Signed)
Please set up your screening mammogram. 

## 2019-12-19 NOTE — Progress Notes (Signed)
Subjective:    Patient ID: Erin Fuentes, female    DOB: 01/30/1971, 49 y.o.   MRN: [id number]  HPI Here for physical This visit occurred during the SARS-CoV-2 public health emergency.  Safety protocols were in place, including screening questions prior to the visit, additional usage of staff PPE, and extensive cleaning of exam room while observing appropriate contact time as indicated for disinfecting solutions.   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 cyanocobalamin 1000 MCG tablet Take 1,000 mcg by mouth every other day.    . hydrocortisone 2.5 % cream Apply topically 3 (three) times daily as needed. 28 g 3  . MELATONIN PO Take by mouth at bedtime as needed (sleep).    . Multiple Vitamin (MULTIVITAMIN) tablet Take 1 tablet by mouth daily.    . SUMAtriptan (IMITREX) 50 MG tablet TAKE 1 TABLET BY MOUTH ONCE FOR 1 DOSE. MAY REPEAT IN 2 HOURS IF HEADACHE PERSISTS OR RECURS. 9 tablet 11   No current facility-administered medications on file prior to visit.    Allergies  Allergen Reactions  . Erythromycin Base Diarrhea    Past Medical History:  Diagnosis Date  . MVP (mitral valve prolapse)   . Vitamin B12 deficiency    oral therapy effective    Past Surgical History:  Procedure Laterality Date  . APPENDECTOMY  2011  . [place]   x 2  . FEMORAL HERNIA REPAIR  ~1978   left ?    Family History  Problem Relation Age of Onset  . Heart disease Maternal Grandmother        died of sudden MI  . Diabetes Other        very prevalent  . Diabetes Paternal Uncle   . Cancer Neg Hx     Social History    Socioeconomic History  . Marital status: Single    Spouse name: Not on file  . Number of children: 2  . Years of education: Not on file  . Highest education level: Not on file  Occupational History  . 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 Smoking status: Never Smoker  . Smokeless tobacco: Never Used  Substance and Sexual Activity  . Alcohol use: No  . Drug use: No  . Sexual activity: Not on file  Other Topics Concern  . Not on file  Social History Narrative  . Not on file   Social Determinants of Health   Financial Resource Strain:   . Difficulty of Paying Living Expenses:   Food Insecurity:   . Worried About Charity fundraiser in the Last Year:   . Arboriculturist in the Last Year:   Transportation Needs:   . Film/video editor (Medical):   Marland Kitchen Lack of Transportation (Non-Medical):   Physical Activity:   . Days of Exercise per Week:   . Minutes of Exercise per Session:   Stress:   . Feeling of Stress :   Social Connections:   . Frequency of Communication with Friends and Family:   . Frequency of Social Gatherings with Friends and Family:   .  Attends Religious Services:   . Active Member of Clubs or Organizations:   . Attends Archivist Meetings:   Marland Kitchen Marital Status:   Intimate Partner Violence:   . Fear of Current or Ex-Partner:   . Emotionally Abused:   Marland Kitchen Physically Abused:   . Sexually Abused:    Review of Systems  Constitutional: Negative for fatigue and unexpected weight change.       Wears seat belt  HENT: Negative for dental problem, hearing loss and tinnitus.        Overdue for dentist  Eyes: Negative for visual disturbance.       No diplopia or unilateral vision loss  Respiratory: Negative for cough, chest tightness and shortness of breath.   Cardiovascular: Negative for chest pain, palpitations and leg swelling.  Gastrointestinal:       No heartburn Less appetite--bowels slower  Genitourinary:  Negative for dyspareunia, dysuria and hematuria.       Periods still regular---3-5 days of bleeding  Musculoskeletal: Negative for arthralgias, back pain and joint swelling.  Skin: Negative for rash.       No suspicious lesions  Allergic/Immunologic: Positive for environmental allergies. Negative for immunocompromised state.       Uses OTC prn  Neurological: Negative for dizziness, syncope and light-headedness.       Sumatriptan still helpful for migraines---less often  Hematological: Negative for adenopathy. Does not bruise/bleed easily.  Psychiatric/Behavioral: Negative for dysphoric mood. The patient is not nervous/anxious.        Sleep cycle not great--chronic. Some daytime somnolence       Objective:   Physical Exam  Constitutional: She is oriented to person, place, and time. She appears well-developed. No distress.  HENT:  Head: Normocephalic and atraumatic.  Right Ear: External ear normal.  Left Ear: External ear normal.  Mouth/Throat: Oropharynx is clear and moist. No oropharyngeal exudate.  Eyes: Pupils are equal, round, and reactive to light. Conjunctivae are normal.  Neck: No thyromegaly present.  Cardiovascular: Normal rate, regular rhythm, normal heart sounds and intact distal pulses. Exam reveals no gallop.  No murmur heard. Respiratory: Effort normal and breath sounds normal. No respiratory distress. She has no wheezes. She has no rales.  GI: Soft. There is no abdominal tenderness.  Musculoskeletal:        General: No tenderness or edema.  Lymphadenopathy:    She has no cervical adenopathy.  Neurological: She is alert and oriented to person, place, and time.  Skin: No rash noted. No erythema.  Psychiatric: She has a normal mood and affect. Her behavior is normal.           Assessment & Plan:

## 2020-03-22 ENCOUNTER — Encounter: Payer: Self-pay | Admitting: Internal Medicine

## 2020-04-19 ENCOUNTER — Encounter: Payer: Self-pay | Admitting: Internal Medicine

## 2020-04-19 ENCOUNTER — Other Ambulatory Visit: Payer: Self-pay

## 2020-04-19 ENCOUNTER — Ambulatory Visit (INDEPENDENT_AMBULATORY_CARE_PROVIDER_SITE_OTHER): Payer: 59 | Admitting: Internal Medicine

## 2020-04-19 DIAGNOSIS — L97521 Non-pressure chronic ulcer of other part of left foot limited to breakdown of skin: Secondary | ICD-10-CM

## 2020-04-19 DIAGNOSIS — R0789 Other chest pain: Secondary | ICD-10-CM | POA: Diagnosis not present

## 2020-04-19 DIAGNOSIS — L97509 Non-pressure chronic ulcer of other part of unspecified foot with unspecified severity: Secondary | ICD-10-CM | POA: Insufficient documentation

## 2020-04-19 DIAGNOSIS — R079 Chest pain, unspecified: Secondary | ICD-10-CM | POA: Insufficient documentation

## 2020-04-19 NOTE — Assessment & Plan Note (Signed)
Today it looks much better (cortisone took away inflammation)---so MRSA much less likely. Actually had the opportunity to get an insect bite that day---before the symptoms started Discussed continuing the antibiotic ointment If worsens, will try doxycycline

## 2020-04-19 NOTE — Assessment & Plan Note (Signed)
Sounds more related to chest wall Nothing worrisome for coronary ischemia Discussed keeping notes on when it happens (especially if related to any conditions or activities) Discussed warning symptoms to call 911---SOB, diaphoresis, etc

## 2020-04-19 NOTE — Progress Notes (Signed)
Subjective:    Patient ID: Erin Fuentes, female    DOB: [birth year], 49 y.o.   MRN: [id number]  HPI Here due to a spider bite This visit occurred during the SARS-CoV-2 public health emergency.  Safety protocols were in place, including screening questions prior to the visit, additional usage of staff PPE, and extensive cleaning of exam room while observing appropriate contact time as indicated for disinfecting solutions.   Thinks she got bit Noted left foot itching---felt it "heat up" Noted crust  First noted 74 Some pus comes out  Had been outside at a festival before noting the itching Tried soaking in epsom salts, neosporin----didn't help Tried cortisone cream last night---it helped redness  Occasional sharp pain under left breast Wondered if it was anxiety related Not activity related--and usually in the night Eases off after 2-3 minutes No SOB  Current Outpatient Medications on File Prior to Visit  Medication Sig Dispense Refill  . cyanocobalamin 1000 MCG tablet Take 1,000 mcg by mouth every other day.    . hydrocortisone 2.5 % cream Apply topically 3 (three) times daily as needed. 28 g 3  . MELATONIN PO Take by mouth at bedtime as needed (sleep).    . Multiple Vitamin (MULTIVITAMIN) tablet Take 1 tablet by mouth daily.    . SUMAtriptan (IMITREX) 50 MG tablet TAKE 1 TABLET BY MOUTH ONCE FOR 1 DOSE. MAY REPEAT IN 2 HOURS IF HEADACHE PERSISTS OR RECURS. 9 tablet 11   No current facility-administered medications on file prior to visit.    Allergies  Allergen Reactions  . Erythromycin Base Diarrhea    Past Medical History:  Diagnosis Date  . MVP (mitral valve prolapse)   . Vitamin B12 deficiency    oral therapy effective    Past Surgical History:  Procedure Laterality Date  . APPENDECTOMY  2011  . [place]   x 2  . FEMORAL HERNIA REPAIR  ~1978   left ?    Family History  Problem Relation Age of Onset  . Heart disease Maternal  Grandmother        died of sudden MI  . Diabetes Other        very prevalent  . Diabetes Paternal Uncle   . Cancer Neg Hx     Social History   Socioeconomic History  . Marital status: Single    Spouse name: Not on file  . Number of children: 2  . Years of education: Not on file  . Highest education level: Not on file  Occupational History  . 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 Smoking status: Never Smoker  . Smokeless tobacco: Never Used  Substance and Sexual Activity  . Alcohol use: No  . Drug use: No  . Sexual activity: Not on file  Other Topics Concern  . Not on file  Social History Narrative  . Not on file   Social Determinants of Health   Financial Resource Strain:   . Difficulty of Paying Living Expenses:   Food Insecurity:   . Worried About Charity fundraiser in the Last Year:   . Arboriculturist in the Last Year:   Transportation Needs:   . Film/video editor (Medical):   Marland Kitchen Lack of Transportation (Non-Medical):   Physical Activity:   . Days of Exercise per Week:   . Minutes of Exercise per Session:   Stress:   . Feeling of Stress :  Social Connections:   . Frequency of Communication with Friends and Family:   . Frequency of Social Gatherings with Friends and Family:   . Attends Religious Services:   . Active Member of Clubs or Organizations:   . Attends Archivist Meetings:   Marland Kitchen Marital Status:   Intimate Partner Violence:   . Fear of Current or Ex-Partner:   . Emotionally Abused:   Marland Kitchen Physically Abused:   . Sexually Abused:    Review of Systems  No fever No other lesions     Objective:   Physical Exam Cardiovascular:     Rate and Rhythm: Normal rate and regular rhythm.     Heart sounds: No murmur heard.  No gallop.   Pulmonary:     Effort: Pulmonary effort is normal.     Breath sounds: Normal breath sounds. No wheezing or rales.     Comments: Mild tenderness along left lower ribs  anteriorly Musculoskeletal:     Cervical back: Neck supple.  Lymphadenopathy:     Cervical: No cervical adenopathy.  Skin:    Comments: Small ulceration at proximal top of left 2nd  Minimal redness now Not warm or sig tenderness            Assessment & Plan:

## 2020-04-21 MED ORDER — DOXYCYCLINE HYCLATE 100 MG PO TABS: 100.0000 mg | ORAL_TABLET | Freq: Two times a day (BID) | ORAL | 0 refills | Status: DC

## 2020-05-27 ENCOUNTER — Encounter: Payer: 59 | Admitting: Gastroenterology

## 2020-06-01 ENCOUNTER — Other Ambulatory Visit: Payer: Self-pay | Admitting: Internal Medicine

## 2020-06-01 ENCOUNTER — Ambulatory Visit (AMBULATORY_SURGERY_CENTER): Payer: Self-pay | Admitting: *Deleted

## 2020-06-01 ENCOUNTER — Other Ambulatory Visit: Payer: Self-pay

## 2020-06-01 VITALS — Ht 65.5 in | Wt 166.0 lb

## 2020-06-01 DIAGNOSIS — Z01818 Encounter for other preprocedural examination: Secondary | ICD-10-CM

## 2020-06-01 MED ORDER — SUPREP BOWEL PREP KIT 17.5-3.13-1.6 GM/177ML PO SOLN: 1.0000 | Freq: Once | ORAL | 0 refills | Status: DC

## 2020-06-01 MED ORDER — SUPREP BOWEL PREP KIT 17.5-3.13-1.6 GM/177ML PO SOLN: 1.0000 | Freq: Once | ORAL | 0 refills | Status: AC

## 2020-06-01 NOTE — Progress Notes (Signed)
No egg or soy allergy known to patient  No issues with past sedation with any surgeries or procedures no intubation problems in the past  No FH of Malignant Hyperthermia No diet pills per patient No home 02 use per patient  No blood thinners per patient  Pt denies issues with constipation  No A fib or A flutter  EMMI video to pt or via MyChart  COVID 19 guidelines implemented in PV today with Pt and RN   X 1 yr, has external Hems, some pain front right abd, US WNL- pain right back- Uses Purelax PRN- she states she does not have BM's daily  But when she has a BM are soft, seedy but not hard balls - in FEb had increased rectal bleeding- determined was from hems- no bleeding noticed since FEB - had appe 2011- pt states appendix was wrapped around an ovary but the ovary was saved and this is the same side and the pain she has   [employer] given to pt in PV today , Code to Pharmacy   Due to the COVID-19 pandemic we are asking patients to follow these guidelines. Please only bring one care partner. Please be aware that your care partner may wait in the car in the parking lot or if they feel like they will be too hot to wait in the car, they may wait in the lobby on the 4th floor. All care partners are required to wear a mask the entire time (we do not have any that we can provide them), they need to practice social distancing, and we will do a Covid check for all patient's and care partners when you arrive. Also we will check their temperature and your temperature. If the care partner waits in their car they need to stay in the parking lot the entire time and we will call them on their cell phone when the patient is ready for discharge so they can bring the car to the front of the building. Also all patient's will need to wear a mask into building.

## 2020-06-02 NOTE — Telephone Encounter (Signed)
Dr Henrene Pastor Pt- Suprep sent to [place] at [facility] yesterday with [place] code Apply Coupon=BIN: [id number] PCN: CN GROUP: [id number] ID: [id number] $97.65 - dropped price significantly per pharmacist At [facility] pt states she cannot swallow Sutab, Dr Henrene Pastor will  Not let pt use Golytely which is prep preference per insurance- Asked Pharmacist to leave at [facility], see if pt will pay the $97.65 and if she refuses to have her call [facility]

## 2020-06-07 ENCOUNTER — Encounter: Payer: Self-pay | Admitting: Internal Medicine

## 2020-06-11 ENCOUNTER — Other Ambulatory Visit
Admission: RE | Admit: 2020-06-11 | Discharge: 2020-06-11 | Disposition: A | Discharge status: Home / Self Care | Payer: 59 | Source: Ambulatory Visit | Attending: Internal Medicine | Admitting: Internal Medicine

## 2020-06-11 ENCOUNTER — Other Ambulatory Visit: Payer: Self-pay

## 2020-06-11 DIAGNOSIS — Z20822 Contact with and (suspected) exposure to covid-19: Secondary | ICD-10-CM | POA: Insufficient documentation

## 2020-06-11 DIAGNOSIS — Z01812 Encounter for preprocedural laboratory examination: Secondary | ICD-10-CM | POA: Diagnosis not present

## 2020-06-11 LAB — SARS CORONAVIRUS 2 (TAT 6-24 HRS): SARS Coronavirus 2: NEGATIVE

## 2020-06-13 ENCOUNTER — Other Ambulatory Visit: Payer: Self-pay | Admitting: Internal Medicine

## 2020-06-15 ENCOUNTER — Encounter: Payer: Self-pay | Admitting: Internal Medicine

## 2020-06-15 ENCOUNTER — Other Ambulatory Visit: Payer: Self-pay

## 2020-06-15 ENCOUNTER — Ambulatory Visit (AMBULATORY_SURGERY_CENTER): Payer: 59 | Admitting: Internal Medicine

## 2020-06-15 VITALS — BP 115/59 | HR 77 | Temp 97.8°F | Resp 13 | Ht 65.5 in | Wt 166.0 lb

## 2020-06-15 DIAGNOSIS — D122 Benign neoplasm of ascending colon: Secondary | ICD-10-CM

## 2020-06-15 DIAGNOSIS — Z1211 Encounter for screening for malignant neoplasm of colon: Secondary | ICD-10-CM | POA: Diagnosis not present

## 2020-06-15 DIAGNOSIS — K635 Polyp of colon: Secondary | ICD-10-CM

## 2020-06-15 MED ORDER — SODIUM CHLORIDE 0.9 % IV SOLN: 500.0000 mL | Freq: Once | INTRAVENOUS | Status: DC

## 2020-06-15 NOTE — Op Note (Signed)
[facility] Patient Name: Erin Fuentes Procedure Date: 06/15/2020 9:35 AM MRN: [id number] Endoscopist: Docia Chuck. Henrene Pastor , MD Age: 48 Referring MD:  Date of Birth: 1971/08/29 Gender: Female Account #: [id number] Procedure:                Colonoscopy with cold snare polypectomy x 2 Indications:              Screening for colorectal malignant neoplasm Medicines:                Monitored Anesthesia Care Procedure:                Pre-Anesthesia Assessment:                           - Prior to the procedure, a History and Physical                            was performed, and patient medications and                            allergies were reviewed. The patient's tolerance of                            previous anesthesia was also reviewed. The risks                            and benefits of the procedure and the sedation                            options and risks were discussed with the patient.                            All questions were answered, and informed consent                            was obtained. Prior Anticoagulants: The patient has                            taken no previous anticoagulant or antiplatelet                            agents. ASA Grade Assessment: II - A patient with                            mild systemic disease. After reviewing the risks                            and benefits, the patient was deemed in                            satisfactory condition to undergo the procedure.                           After obtaining informed consent, the colonoscope  was passed under direct vision. Throughout the                            procedure, the patient's blood pressure, pulse, and                            oxygen saturations were monitored continuously. The                            Colonoscope was introduced through the anus and                            advanced to the the cecum, identified by                             appendiceal orifice and ileocecal valve. The                            ileocecal valve, appendiceal orifice, and rectum                            were photographed. The quality of the bowel                            preparation was excellent. The colonoscopy was                            performed without difficulty. The patient tolerated                            the procedure well. The bowel preparation used was                            SUPREP via split dose instruction. Scope In: 9:51:17 AM Scope Out: 10:05:36 AM Scope Withdrawal Time: 0 hours 11 minutes 36 seconds  Total Procedure Duration: 0 hours 14 minutes 19 seconds  Findings:                 Two sessile polyps were found in the proximal                            ascending colon. The polyps were 4 to 6 mm in size.                            These polyps were removed with a cold snare.                            Resection and retrieval were complete.                           Internal hemorrhoids were found during retroflexion.                           The exam was otherwise without abnormality on  direct and retroflexion views. Complications:            No immediate complications. Estimated blood loss:                            None. Estimated Blood Loss:     Estimated blood loss: none. Impression:               - Two 4 to 6 mm polyps in the proximal ascending                            colon, removed with a cold snare. Resected and                            retrieved.                           - Internal hemorrhoids.                           - The examination was otherwise normal on direct                            and retroflexion views. Recommendation:           - Repeat colonoscopy in 5 years for surveillance.                           - Patient has a contact number available for                            emergencies. The signs and symptoms of potential                             delayed complications were discussed with the                            patient. Return to normal activities tomorrow.                            Written discharge instructions were provided to the                            patient.                           - Resume previous diet.                           - Continue present medications.                           - Await pathology results. Docia Chuck. Henrene Pastor, MD 06/15/2020 10:16:20 AM This report has been signed electronically.

## 2020-06-15 NOTE — Patient Instructions (Signed)
Handouts provided on polyps and hemorrhoids.   YOU HAD AN ENDOSCOPIC PROCEDURE TODAY AT THE [facility]:   Refer to the procedure report that was given to you for any specific questions about what was found during the examination.  If the procedure report does not answer your questions, please call your gastroenterologist to clarify.  If you requested that your care partner not be given the details of your procedure findings, then the procedure report has been included in a sealed envelope for you to review at your convenience later.  YOU SHOULD EXPECT: Some feelings of bloating in the abdomen. Passage of more gas than usual.  Walking can help get rid of the air that was put into your GI tract during the procedure and reduce the bloating. If you had a lower endoscopy (such as a colonoscopy or flexible sigmoidoscopy) you may notice spotting of blood in your stool or on the toilet paper. If you underwent a bowel prep for your procedure, you may not have a normal bowel movement for a few days.  Please Note:  You might notice some irritation and congestion in your nose or some drainage.  This is from the oxygen used during your procedure.  There is no need for concern and it should clear up in a day or so.  SYMPTOMS TO REPORT IMMEDIATELY:  Following lower endoscopy (colonoscopy or flexible sigmoidoscopy):  Excessive amounts of blood in the stool  Significant tenderness or worsening of abdominal pains  Swelling of the abdomen that is new, acute  Fever of 100F or higher  For urgent or emergent issues, a gastroenterologist can be reached at any hour by calling [phone]. Do not use MyChart messaging for urgent concerns.    DIET:  We do recommend a small meal at first, but then you may proceed to your regular diet.  Drink plenty of fluids but you should avoid alcoholic beverages for 24 hours.  ACTIVITY:  You should plan to take it easy for the rest of today and you should NOT DRIVE  or use heavy machinery until tomorrow (because of the sedation medicines used during the test).    FOLLOW UP: Our staff will call the number listed on your records 48-72 hours following your procedure to check on you and address any questions or concerns that you may have regarding the information given to you following your procedure. If we do not reach you, we will leave a message.  We will attempt to reach you two times.  During this call, we will ask if you have developed any symptoms of COVID 19. If you develop any symptoms (ie: fever, flu-like symptoms, shortness of breath, cough etc.) before then, please call [phone].  If you test positive for Covid 19 in the 2 weeks post procedure, please call and report this information to us.    If any biopsies were taken you will be contacted by phone or by letter within the next 1-3 weeks.  Please call us at [phone] if you have not heard about the biopsies in 3 weeks.    SIGNATURES/CONFIDENTIALITY: You and/or your care partner have signed paperwork which will be entered into your electronic medical record.  These signatures attest to the fact that that the information above on your After Visit Summary has been reviewed and is understood.  Full responsibility of the confidentiality of this discharge information lies with you and/or your care-partner.  

## 2020-06-15 NOTE — Progress Notes (Signed)
VS- Erin Fuentes  Pt's states no medical or surgical changes since previsit or office visit.  Called to room to assist during endoscopic procedure.  Patient ID and intended procedure confirmed with present staff. Received instructions for my participation in the procedure from the performing physician.  

## 2020-06-15 NOTE — Progress Notes (Signed)
PT taken to PACU. Monitors in place. VSS. Report given to RN. 

## 2020-06-15 NOTE — Progress Notes (Signed)
Pt had mild cramping in recovery d/t gas from the procedure. She was able to expel a good amount of gas in recovery. Levsin 0.125mg  given and pt reported relief of gas pains. She did still c/o RLQ abd pain which she was having prior to the start of her procedure. In admitting she reported it to be 6/10 pain and a chronic pain. No new pain at time of discharge. Pt encouraged to take the OTC medications that she has been taking to help alleviate the pain once she is home and has some food and drink.

## 2020-06-17 ENCOUNTER — Telehealth: Payer: Self-pay

## 2020-06-17 NOTE — Telephone Encounter (Signed)
LVM

## 2020-06-17 NOTE — Telephone Encounter (Signed)
°  Follow up Call-  Call back number 06/15/2020  Post procedure Call Back phone  # [phone]  Permission to leave phone message Yes  Some recent data might be hidden     Patient questions:  Do you have a fever, pain , or abdominal swelling? No. Pain Score  0 *  Have you tolerated food without any problems? Yes.    Have you been able to return to your normal activities? Yes.    Do you have any questions about your discharge instructions: Diet   No. Medications  No. Follow up visit  No.  Do you have questions or concerns about your Care? No.  Actions: * If pain score is 4 or above: 1. No action needed, pain <4.Have you developed a fever since your procedure? no  2.   Have you had an respiratory symptoms (SOB or cough) since your procedure? no  3.   Have you tested positive for COVID 19 since your procedure no  4.   Have you had any family members/close contacts diagnosed with the COVID 19 since your procedure?  no   If yes to any of these questions please route to Joylene John, RN and Joella Prince, RN

## 2020-06-18 ENCOUNTER — Encounter: Payer: Self-pay | Admitting: Internal Medicine

## 2020-07-07 ENCOUNTER — Telehealth: Payer: Self-pay | Admitting: *Deleted

## 2020-08-03 NOTE — Telephone Encounter (Signed)
Follow up call made. 

## 2020-12-16 ENCOUNTER — Other Ambulatory Visit: Payer: Self-pay | Admitting: Internal Medicine

## 2020-12-24 ENCOUNTER — Encounter: Payer: 59 | Admitting: Internal Medicine

## 2020-12-29 ENCOUNTER — Encounter: Payer: Self-pay | Admitting: Internal Medicine

## 2020-12-29 ENCOUNTER — Ambulatory Visit (INDEPENDENT_AMBULATORY_CARE_PROVIDER_SITE_OTHER): Payer: 59 | Admitting: Internal Medicine

## 2020-12-29 ENCOUNTER — Other Ambulatory Visit: Payer: Self-pay

## 2020-12-29 VITALS — BP 112/78 | HR 76 | Temp 98.3°F | Ht 65.25 in | Wt 175.0 lb

## 2020-12-29 DIAGNOSIS — Z0000 Encounter for general adult medical examination without abnormal findings: Secondary | ICD-10-CM | POA: Diagnosis not present

## 2020-12-29 DIAGNOSIS — I872 Venous insufficiency (chronic) (peripheral): Secondary | ICD-10-CM

## 2020-12-29 DIAGNOSIS — E538 Deficiency of other specified B group vitamins: Secondary | ICD-10-CM

## 2020-12-29 LAB — COMPREHENSIVE METABOLIC PANEL
ALT: 12 U/L (ref 0–35)
AST: 21 U/L (ref 0–37)
Albumin: 4.4 g/dL (ref 3.5–5.2)
Alkaline Phosphatase: 61 U/L (ref 39–117)
BUN: 10 mg/dL (ref 6–23)
CO2: 28 mEq/L (ref 19–32)
Calcium: 9.2 mg/dL (ref 8.4–10.5)
Chloride: 102 mEq/L (ref 96–112)
Creatinine, Ser: 0.79 mg/dL (ref 0.40–1.20)
GFR: 86.95 mL/min (ref >60.00)
Glucose, Bld: 92 mg/dL (ref 70–99)
Potassium: 4 mEq/L (ref 3.5–5.1)
Sodium: 137 mEq/L (ref 135–145)
Total Bilirubin: 1.1 mg/dL (ref 0.2–1.2)
Total Protein: 6.6 g/dL (ref 6.0–8.3)

## 2020-12-29 LAB — CBC
HCT: 38 % (ref 36.0–46.0)
Hemoglobin: 13.2 g/dL (ref 12.0–15.0)
MCHC: 34.6 g/dL (ref 30.0–36.0)
MCV: 92.2 fl (ref 78.0–100.0)
Platelets: 298 10*3/uL (ref 150.0–400.0)
RBC: 4.13 Mil/uL (ref 3.87–5.11)
RDW: 12.9 % (ref 11.5–15.5)
WBC: 5.4 10*3/uL (ref 4.0–10.5)

## 2020-12-29 LAB — T4, FREE: Free T4: 0.91 ng/dL (ref 0.60–1.60)

## 2020-12-29 LAB — VITAMIN B12: Vitamin B-12: 241 pg/mL (ref 211–911)

## 2020-12-29 NOTE — Progress Notes (Signed)
Subjective:    Patient ID: Erin Fuentes, female    DOB: December 02, 1970, 50 y.o.   MRN: [id number]  HPI Here for physical This visit occurred during the SARS-CoV-2 public health emergency.  Safety protocols were in place, including screening questions prior to the visit, additional usage of staff PPE, and extensive cleaning of exam room while observing appropriate contact time as indicated for disinfecting solutions.   Has been more tired than normal---lower energy levels Legs will painful "and tired'---after a full day of work (on her feet) Hasn't tried support hose or socks Not depressed Likes her work Has been walking some Not much social activities--but this is not new  Wonders about it being hormonal Periods still regular---some right pain before cycles (not new and ultrasound was normal)  Current Outpatient Medications on File Prior to Visit  Medication Sig Dispense Refill  . cyanocobalamin 1000 MCG tablet Take 1,000 mcg by mouth every other day.    . hydrocortisone 2.5 % cream APPLY TOPICALLY 3 (THREE) TIMES DAILY AS NEEDED. 28.35 g 3  . Multiple Vitamin (MULTIVITAMIN) tablet Take 1 tablet by mouth daily.    . Naproxen Sodium (ALEVE PO) Take by mouth as needed.    . SUMAtriptan (IMITREX) 50 MG tablet TAKE 1 TABLET BY MOUTH ONCE FOR 1 DOSE. MAY REPEAT IN 2 HOURS IF HEADACHE PERSISTS OR RECURS. 9 tablet 0   No current facility-administered medications on file prior to visit.    Allergies  Allergen Reactions  . Erythromycin Base Diarrhea  . Morphine And Related Nausea And Vomiting    And excessive sweating     Past Medical History:  Diagnosis Date  . Allergy    with seasonal changes   . Hemorrhoids   . MVP (mitral valve prolapse)   . Scoliosis    mild per pt   . Vitamin B12 deficiency    oral therapy effective    Past Surgical History:  Procedure Laterality Date  . APPENDECTOMY  2011   wrapped around ovary , saved Ovary-   . [place]   x  2  . FEMORAL HERNIA REPAIR  ~1978   left ?    Family History  Problem Relation Age of Onset  . Colon polyps Mother   . Heart disease Maternal Grandmother        died of sudden MI  . Diabetes Other        very prevalent  . Diabetes Paternal Uncle   . Other Maternal Grandfather        4 tumors behind intestine but were benign, no cancer- used radiation to shrink intestines   . Hyperthyroidism Nephew   . Hyperthyroidism Maternal Uncle   . Cancer Neg Hx   . Colon cancer Neg Hx   . Esophageal cancer Neg Hx   . Stomach cancer Neg Hx   . Rectal cancer Neg Hx     Social History   Socioeconomic History  . Marital status: Single    Spouse name: Not on file  . Number of children: 2  . Years of education: Not on file  . Highest education level: Not on file  Occupational History  . Occupation: Contractor in [place]  Tobacco Use  . Smoking status: Never Smoker  . Smokeless tobacco: Never Used  Vaping Use  . Vaping Use: Never used  Substance and Sexual Activity  . Alcohol use: No  . Drug use: No  . Sexual activity: Not  on file  Other Topics Concern  . Not on file  Social History Narrative  . Not on file   Social Determinants of Health   Financial Resource Strain: Not on file  Food Insecurity: Not on file  Transportation Needs: Not on file  Physical Activity: Not on file  Stress: Not on file  Social Connections: Not on file  Intimate Partner Violence: Not on file   Review of Systems  Constitutional: Positive for fatigue.       Weight up slightly Wears seat belt  HENT: Negative for dental problem, hearing loss and tinnitus.        Overdue for dentist  Eyes: Negative for visual disturbance.       No diplopia or unilateral vision loss  Respiratory: Negative for cough, chest tightness and shortness of breath.   Cardiovascular: Positive for leg swelling. Negative for chest pain and palpitations.  Gastrointestinal: Negative for blood in stool  and constipation.       No heartburn  Endocrine: Negative for polydipsia and polyuria.  Musculoskeletal: Negative for arthralgias, back pain and joint swelling.  Skin: Negative for rash.       No suspicious skin lesions  Allergic/Immunologic: Positive for environmental allergies. Negative for immunocompromised state.       Uses OTC allergy med prn--pretty rare  Neurological: Negative for dizziness, syncope and light-headedness.       Headaches with cycles--meds help  Hematological: Negative for adenopathy. Does not bruise/bleed easily.  Psychiatric/Behavioral: Negative for dysphoric mood. The patient is not nervous/anxious.        Occasional sleep problems--needs to work on schedule       Objective:   Physical Exam Constitutional:      Appearance: Normal appearance.  HENT:     Right Ear: Tympanic membrane, ear canal and external ear normal.     Left Ear: Tympanic membrane, ear canal and external ear normal.     Mouth/Throat:     Pharynx: No oropharyngeal exudate or posterior oropharyngeal erythema.  Eyes:     Conjunctiva/sclera: Conjunctivae normal.     Pupils: Pupils are equal, round, and reactive to light.  Cardiovascular:     Rate and Rhythm: Normal rate and regular rhythm.     Pulses: Normal pulses.     Heart sounds: No murmur heard. No gallop.   Pulmonary:     Effort: Pulmonary effort is normal.     Breath sounds: Normal breath sounds. No wheezing or rales.  Abdominal:     Palpations: Abdomen is soft.     Tenderness: There is no abdominal tenderness.  Musculoskeletal:     Cervical back: Neck supple.     Right lower leg: No edema.     Left lower leg: No edema.  Lymphadenopathy:     Cervical: No cervical adenopathy.  Skin:    General: Skin is warm.     Findings: No rash.     Comments: Mild varicosities in calves  Neurological:     General: No focal deficit present.     Mental Status: She is alert and oriented to person, place, and time.  Psychiatric:         Mood and Affect: Mood normal.        Behavior: Behavior normal.            Assessment & Plan:

## 2020-12-29 NOTE — Assessment & Plan Note (Signed)
Takes supplements sporadically

## 2020-12-29 NOTE — Assessment & Plan Note (Signed)
Healthy but needs to work on fitness Mammogram due--she will set up Colon due again 2026 Pap due today---but she wishes to defer to next year Prefers no flu/COVID/shingles vaccines----urged her to reconsider COVID if surges again

## 2020-12-29 NOTE — Assessment & Plan Note (Signed)
Early varicosities Aching at the end of the day---discussed support hose/socks

## 2021-05-20 ENCOUNTER — Emergency Department: Payer: 59

## 2021-05-20 ENCOUNTER — Encounter: Payer: Self-pay | Admitting: Radiology

## 2021-05-20 ENCOUNTER — Emergency Department
Admission: EM | Admit: 2021-05-20 | Discharge: 2021-05-20 | Disposition: A | Discharge status: Home / Self Care | Payer: 59 | Attending: Emergency Medicine | Admitting: Emergency Medicine

## 2021-05-20 ENCOUNTER — Other Ambulatory Visit: Payer: Self-pay

## 2021-05-20 DIAGNOSIS — R1031 Right lower quadrant pain: Secondary | ICD-10-CM | POA: Insufficient documentation

## 2021-05-20 DIAGNOSIS — X58XXXA Exposure to other specified factors, initial encounter: Secondary | ICD-10-CM | POA: Diagnosis not present

## 2021-05-20 DIAGNOSIS — R000 Tachycardia, unspecified: Secondary | ICD-10-CM | POA: Insufficient documentation

## 2021-05-20 DIAGNOSIS — S39012A Strain of muscle, fascia and tendon of lower back, initial encounter: Secondary | ICD-10-CM | POA: Diagnosis not present

## 2021-05-20 DIAGNOSIS — R531 Weakness: Secondary | ICD-10-CM | POA: Diagnosis not present

## 2021-05-20 DIAGNOSIS — S3992XA Unspecified injury of lower back, initial encounter: Secondary | ICD-10-CM | POA: Diagnosis present

## 2021-05-20 LAB — CBC
HCT: 38 % (ref 36.0–46.0)
Hemoglobin: 13.2 g/dL (ref 12.0–15.0)
MCH: 32.4 pg (ref 26.0–34.0)
MCHC: 34.7 g/dL (ref 30.0–36.0)
MCV: 93.1 fL (ref 80.0–100.0)
Platelets: 269 10*3/uL (ref 150–400)
RBC: 4.08 MIL/uL (ref 3.87–5.11)
RDW: 11.9 % (ref 11.5–15.5)
WBC: 8.9 10*3/uL (ref 4.0–10.5)
nRBC: 0 % (ref 0.0–0.2)

## 2021-05-20 LAB — LIPASE, BLOOD: Lipase: 26 U/L (ref 11–51)

## 2021-05-20 LAB — HEPATIC FUNCTION PANEL
ALT: 14 U/L (ref 0–44)
AST: 26 U/L (ref 15–41)
Albumin: 4.2 g/dL (ref 3.5–5.0)
Alkaline Phosphatase: 55 U/L (ref 38–126)
Bilirubin, Direct: 0.1 mg/dL (ref 0.0–0.2)
Indirect Bilirubin: 1 mg/dL — ABNORMAL HIGH (ref 0.3–0.9)
Total Bilirubin: 1.1 mg/dL (ref 0.3–1.2)
Total Protein: 7.2 g/dL (ref 6.5–8.1)

## 2021-05-20 LAB — BASIC METABOLIC PANEL
Anion gap: 5 (ref 5–15)
BUN: 9 mg/dL (ref 6–20)
CO2: 25 mmol/L (ref 22–32)
Calcium: 8.4 mg/dL — ABNORMAL LOW (ref 8.9–10.3)
Chloride: 104 mmol/L (ref 98–111)
Creatinine, Ser: 0.75 mg/dL (ref 0.44–1.00)
GFR, Estimated: >60 mL/min (ref >60)
Glucose, Bld: 101 mg/dL — ABNORMAL HIGH (ref 70–99)
Potassium: 3.7 mmol/L (ref 3.5–5.1)
Sodium: 134 mmol/L — ABNORMAL LOW (ref 135–145)

## 2021-05-20 LAB — POC URINE PREG, ED: Preg Test, Ur: NEGATIVE

## 2021-05-20 LAB — TROPONIN I (HIGH SENSITIVITY): Troponin I (High Sensitivity): 4 ng/L (ref <18)

## 2021-05-20 MED ORDER — LIDOCAINE 5 % EX PTCH
1.0000 | MEDICATED_PATCH | CUTANEOUS | Status: DC
  Administered 2021-05-20: 1 via TRANSDERMAL
  Filled 2021-05-20: qty 1

## 2021-05-20 MED ORDER — LACTATED RINGERS IV BOLUS
1000.0000 mL | Freq: Once | INTRAVENOUS | Status: AC
  Administered 2021-05-20: 1000 mL via INTRAVENOUS

## 2021-05-20 MED ORDER — ACETAMINOPHEN 500 MG PO TABS
1000.0000 mg | ORAL_TABLET | Freq: Once | ORAL | Status: AC
  Administered 2021-05-20: 1000 mg via ORAL
  Filled 2021-05-20: qty 2

## 2021-05-20 MED ORDER — IBUPROFEN 400 MG PO TABS: 800.0000 mg | ORAL_TABLET | Freq: Three times a day (TID) | ORAL | 0 refills | Status: AC | PRN

## 2021-05-20 MED ORDER — KETOROLAC TROMETHAMINE 30 MG/ML IJ SOLN
15.0000 mg | Freq: Once | INTRAMUSCULAR | Status: AC
  Administered 2021-05-20: 15 mg via INTRAVENOUS
  Filled 2021-05-20: qty 1

## 2021-05-20 MED ORDER — HYDROMORPHONE HCL 1 MG/ML IJ SOLN
0.5000 mg | Freq: Once | INTRAMUSCULAR | Status: AC
  Administered 2021-05-20: 0.5 mg via INTRAVENOUS
  Filled 2021-05-20: qty 1

## 2021-05-20 NOTE — ED Notes (Signed)
Performed Bladder Scan and Pt. Measured 140m.

## 2021-05-20 NOTE — ED Notes (Signed)
Pt presents today with c/o of low back pain, pt denies HX of chronic back pain.   Pt states she woke up suddenly this morning and had sudden back pain and tingling in her calf and toes which has never happened to her. Pt denies issues with incontinence. Pt denies any injury to trauma to this area. Pt denies recent illness or feeling sick.  Per EMS pt was in SVT on arrival (see triage note). Pt placed on cardiac monitor and HR in 120-130's. Pt is A&Ox4 and tearful at this time.

## 2021-05-20 NOTE — ED Triage Notes (Signed)
Pt comes into the ED via [facility] from home c/o back pain. Pt was in SVT when EMS for arrived, 500 ml Nacl fluid bolus given and HR decreased to 128.  20 L hand.  CBG 130, 132/82.  Pt states she woke up with the sudden low back pain, denies any known injury.  Febrile at 101.4.

## 2021-05-20 NOTE — ED Notes (Signed)
Pt at XRAY

## 2021-05-20 NOTE — ED Notes (Signed)
Pts. POC was NEGATIVE.

## 2021-05-20 NOTE — Discharge Instructions (Addendum)
Please return to the emergency department if you develop difficulty urinating, fever, weakness in your legs, or cannot walk. You can use the lidocaine patches and motrin for pain.

## 2021-05-20 NOTE — ED Provider Notes (Addendum)
[facility]  ____________________________________________   Event Date/Time   First MD Initiated Contact with Patient 05/20/21 [phone]     (approximate)  I have reviewed the triage vital signs and the nursing notes.   HISTORY  Chief Complaint Back Pain, Tachycardia, and Weakness    HPI Erin Fuentes is a 50 y.o. female pmh MVP who p/w back pain.  Patient symptoms started when she woke up and tried to get out of bed.  She endorses pain in the lower back that is worse on the right but is bilateral.  She also has some associated pain in the right calf and numbness in her bilateral great toes.  She denies any radiation of the pain down to the legs.  She has not urinated since she woke up but denies urinary or fecal incontinence.  She denies any preceding injury although she has been taking care of her mom who was sick and has been bending down frequently.  She has no prior history of back pain.  Tells me that this numbness in her toes has been going on for several months.  Patient also endorses pain in the right upper quadrant.,  Which is new today.  No nausea or vomiting.  She denies fevers or chills.  No history of IV drug use.  No history of anticoagulation.  She has not taken anything for pain yet.  She denies any numbness in her trunk or back area.  She had difficulty ambulating today secondary to pain.  She does not think that she is actually weak in her extremities.         Past Medical History:  Diagnosis Date   Allergy    with seasonal changes    Hemorrhoids    MVP (mitral valve prolapse)    Scoliosis    mild per pt    Vitamin B12 deficiency    oral therapy effective    Patient Active Problem List   Diagnosis Date Noted   Chronic venous insufficiency 12/29/2020   Chest pain 04/19/2020   Migraine headache without aura 03/14/2018   Perimenopausal disorder 11/04/2015   Routine general medical examination at a health care facility 02/29/2012    Vitamin B12 deficiency    MITRAL VALVE PROLAPSE 08/09/2010    Past Surgical History:  Procedure Laterality Date   APPENDECTOMY  2011   wrapped around ovary , saved Ovary-    [place]   x 2   FEMORAL HERNIA REPAIR  ~1978   left ?    Prior to Admission medications   Medication Sig Start Date End Date Taking? Authorizing Provider  ibuprofen (ADVIL) 400 MG tablet Take 2 tablets (800 mg total) by mouth every 8 (eight) hours as needed. 05/20/21 06/19/21 Yes Rada Hay, MD  cyanocobalamin 1000 MCG tablet Take 1,000 mcg by mouth every other day.    [provider]  hydrocortisone 2.5 % cream APPLY TOPICALLY 3 (THREE) TIMES DAILY AS NEEDED. 06/15/20   Venia Carbon, MD  Multiple Vitamin (MULTIVITAMIN) tablet Take 1 tablet by mouth daily.    [provider]  Naproxen Sodium (ALEVE PO) Take by mouth as needed.    [provider]  SUMAtriptan (IMITREX) 50 MG tablet TAKE 1 TABLET BY MOUTH ONCE FOR 1 DOSE. MAY REPEAT IN 2 HOURS IF HEADACHE PERSISTS OR RECURS. 12/16/20   Venia Carbon, MD    Allergies Erythromycin base and Morphine and related  Family History  Problem Relation  Age of Onset   Colon polyps Mother    Heart disease Maternal Grandmother        died of sudden MI   Diabetes Other        very prevalent   Diabetes Paternal Uncle    Other Maternal Grandfather        4 tumors behind intestine but were benign, no cancer- used radiation to shrink intestines    Hyperthyroidism Nephew    Hyperthyroidism Maternal Uncle    Cancer Neg Hx    Colon cancer Neg Hx    Esophageal cancer Neg Hx    Stomach cancer Neg Hx    Rectal cancer Neg Hx     Social History Social History   Tobacco Use   Smoking status: Never   Smokeless tobacco: Never  Vaping Use   Vaping Use: Never used  Substance Use Topics   Alcohol use: No   Drug use: No    Review of Systems   Review of Systems  Constitutional:  Negative for chills and fever.   Respiratory:  Negative for shortness of breath.   Cardiovascular:  Negative for chest pain.  Gastrointestinal:  Positive for abdominal pain. Negative for nausea and vomiting.  Genitourinary:  Negative for difficulty urinating, dysuria and hematuria.  Musculoskeletal:  Positive for back pain and myalgias.  Neurological:  Positive for numbness.  All other systems reviewed and are negative.  Physical Exam Updated Vital Signs BP (!) 144/82   Pulse (!) 107   Temp 98.7 F (37.1 C) (Oral)   Resp 18   Ht '5\' 6"'$  (1.676 m)   Wt 79.4 kg   SpO2 100%   BMI 28.25 kg/m   Physical Exam Vitals and nursing note reviewed.  Constitutional:      General: She is in acute distress.     Appearance: Normal appearance.     Comments: Appears to be in pain  HENT:     Head: Normocephalic and atraumatic.  Eyes:     General: No scleral icterus.    Conjunctiva/sclera: Conjunctivae normal.  Pulmonary:     Effort: Pulmonary effort is normal. No respiratory distress.     Breath sounds: No stridor.  Abdominal:     Palpations: Abdomen is soft.     Tenderness: There is abdominal tenderness.     Comments: Mild tenderness to palpation in the right upper quadrant  Musculoskeletal:        General: No swelling, tenderness, deformity or signs of injury.     Cervical back: Normal range of motion.     Comments: Patient has mild pain with tenderness to palpation of the midline lumbar spine Tenderness to palpation of the right greater than left paraspinal areas 5 out of 5 strength with hip flexion, plantarflexion, dorsiflexion and great toe extension 2+ patellar reflexes bilaterally Sensation subjectively diminished in the bilateral great toes and right calf  Skin:    General: Skin is dry.     Coloration: Skin is not jaundiced or pale.  Neurological:     General: No focal deficit present.     Mental Status: She is alert and oriented to person, place, and time. Mental status is at baseline.  Psychiatric:         Mood and Affect: Mood normal.        Behavior: Behavior normal.     LABS (all labs ordered are listed, but only abnormal results are displayed)  Labs Reviewed  BASIC METABOLIC PANEL - Abnormal; Notable for the  following components:      Result Value   Sodium 134 (*)    Glucose, Bld 101 (*)    Calcium 8.4 (*)    All other components within normal limits  HEPATIC FUNCTION PANEL - Abnormal; Notable for the following components:   Indirect Bilirubin 1.0 (*)    All other components within normal limits  CBC  LIPASE, BLOOD  URINALYSIS, COMPLETE (UACMP) WITH MICROSCOPIC  POC URINE PREG, ED  TROPONIN I (HIGH SENSITIVITY)   ____________________________________________  EKG  Sinus tachycardia, normal intervals, normal axis, no acute ST or T wave changes, no ischemic changes ____________________________________________  RADIOLOGY I, Madelin Headings, personally viewed and evaluated these images (plain radiographs) as part of my medical decision making, as well as reviewing the written report by the radiologist.  ED MD interpretation: CT abdomen pelvis does not show any acute abnormality    ____________________________________________   PROCEDURES  Procedure(s) performed (including Critical Care):  Procedures   ____________________________________________   INITIAL IMPRESSION / ASSESSMENT AND PLAN / ED COURSE     The patient is a 50 year old female who presents with acute onset of lower back pain.  On arrival the patient is tachycardic to the 130s.  Her EKG shows sinus tachycardia.  She was reported to be in SVT by EMS and when I am in the room she briefly did go into SVT which quickly broke to sinus tachycardia.  The patient's back pain is somewhat difficult to characterize seems to be both midline and paraspinal without radicular symptoms.  She does however complain of some numbness in her bilateral toes and in her right calf although this has been going on for months.   There is no acute injury preceding this, but she has been helping move her mom who is will.  She has no red flag symptoms for cauda equina including no incontinence, fevers, IV drug use immunocompromise or saddle anesthesia.  Given his complaint of numbness with back pain I considered transverse myelitis however she has normal strength and no sensory level.   She also complains of some abdominal pain, with minimal tenderness on exam.  We will start with a non-con CT of her abdomen to rule out sone or other intra-abdominal process as a cause of her pain.  I have very low suspicion for aortic dissection as she is not having chest pain she is not hypertensive and she has no risk factors for this.  Also low suspicion for pulmonary embolism given she has no chest pain or shortness of breath.  I suspect her tachycardia is related to pain.  If patient unable to ambulate after pain control will likely need an MRI of her lumbar spine.  Tachycardia improved after Dilaudid.  CT abdomen pelvis does not show any renal stone or other pathology.  Patient urinated without issue, postvoid residual was somewhat delayed but was 152m.  UA sent down to lab, but unable to run due to technical issues, however,pt without any urinary symptoms and pain is not in CVA region so will not resend. I observed the patient ambulate to the bathroom and back she was able to sit down on the toilet and get up on her own.  Denying any weakness in her legs.  I suspect that this is a lumbar strain.  With her normal strength and no other red flag symptoms, will discharge with lidocaine and ibuprofen.  We discussed return precautions.  Patient was agreeable with the plan.  Clinical Course as of 05/20/21 1638  Fri May 20, 2021  1053 Troponin I (High Sensitivity): 4 [KM]  1210 IMPRESSION: No acute or inflammatory process identified in the noncontrast abdomen or pelvis;   Trace simple density fluid in the pelvis is likely physiologic.   Small  chronic hiatal hernia and duodenal diverticula.   [KM]  [id number] PT ambulated to toilet, was able to get up on her own and back into bed; denies weakness of legs [KM]  1345 Preg Test, Ur: NEGATIVE [KM]  1345 PVR- delayed aprox 1.5 hrs after urinating Is 145cc [KM]    Clinical Course User Index [KM] Rada Hay, MD     ____________________________________________   FINAL CLINICAL IMPRESSION(S) / ED DIAGNOSES  Final diagnoses:  Strain of lumbar region, initial encounter     ED Discharge Orders          Ordered    ibuprofen (ADVIL) 400 MG tablet  Every 8 hours PRN        05/20/21 1428             Note:  This document was prepared using Dragon voice recognition software and may include unintentional dictation errors.    Rada Hay, MD 05/20/21 1431    Rada Hay, MD 05/20/21 1432    Rada Hay, MD 05/20/21 1439    Rada Hay, MD 05/20/21 [phone]

## 2021-05-27 ENCOUNTER — Telehealth: Payer: Self-pay

## 2021-05-27 NOTE — Telephone Encounter (Signed)
Clled pt to see how she was doing after recent ER visit. She said she is still having some nasal and chest congestion, but that it is getting a little better. Still feels tired. I made her an OV for 05-31-21 at 915.

## 2021-05-31 ENCOUNTER — Ambulatory Visit (INDEPENDENT_AMBULATORY_CARE_PROVIDER_SITE_OTHER): Payer: 59 | Admitting: Internal Medicine

## 2021-05-31 ENCOUNTER — Encounter: Payer: Self-pay | Admitting: Internal Medicine

## 2021-05-31 ENCOUNTER — Other Ambulatory Visit: Payer: Self-pay

## 2021-05-31 DIAGNOSIS — S335XXD Sprain of ligaments of lumbar spine, subsequent encounter: Secondary | ICD-10-CM | POA: Diagnosis not present

## 2021-05-31 DIAGNOSIS — I872 Venous insufficiency (chronic) (peripheral): Secondary | ICD-10-CM | POA: Diagnosis not present

## 2021-05-31 DIAGNOSIS — S335XXA Sprain of ligaments of lumbar spine, initial encounter: Secondary | ICD-10-CM | POA: Insufficient documentation

## 2021-05-31 DIAGNOSIS — S80869A Insect bite (nonvenomous), unspecified lower leg, initial encounter: Secondary | ICD-10-CM

## 2021-05-31 DIAGNOSIS — I471 Supraventricular tachycardia, unspecified: Secondary | ICD-10-CM

## 2021-05-31 DIAGNOSIS — W57XXXA Bitten or stung by nonvenomous insect and other nonvenomous arthropods, initial encounter: Secondary | ICD-10-CM

## 2021-05-31 MED ORDER — TRIAMCINOLONE ACETONIDE 0.1 % EX CREA: 1.0000 "application " | TOPICAL_CREAM | Freq: Two times a day (BID) | CUTANEOUS | 1 refills | Status: DC | PRN

## 2021-05-31 NOTE — Assessment & Plan Note (Signed)
With some varicosities Recommended holding off on vascular evaluation for now---just stick with support hose

## 2021-05-31 NOTE — Progress Notes (Signed)
Subjective:    Patient ID: Erin Fuentes, female    DOB: 11-07-1970, 50 y.o.   MRN: [id number]  HPI Here for ER follow up This visit occurred during the SARS-CoV-2 public health emergency.  Safety protocols were in place, including screening questions prior to the visit, additional usage of staff PPE, and extensive cleaning of exam room while observing appropriate contact time as indicated for disinfecting solutions.   Has severe upper lumbar back pain--couldn't even walk Awoke and couldn't even walk To ER CT abdomen done due to pain--unremarkable Did have documented short bursts of SVT  No palpitations till that day (with the pain) Battling some chest congestion---no cough or fever. Using steam and hot tea Vick's vaporub  Current Outpatient Medications on File Prior to Visit  Medication Sig Dispense Refill   cyanocobalamin 1000 MCG tablet Take 1,000 mcg by mouth every other day.     hydrocortisone 2.5 % cream APPLY TOPICALLY 3 (THREE) TIMES DAILY AS NEEDED. 28.35 g 3   ibuprofen (ADVIL) 400 MG tablet Take 2 tablets (800 mg total) by mouth every 8 (eight) hours as needed. 90 tablet 0   Multiple Vitamin (MULTIVITAMIN) tablet Take 1 tablet by mouth daily.     Naproxen Sodium (ALEVE PO) Take by mouth as needed.     SUMAtriptan (IMITREX) 50 MG tablet TAKE 1 TABLET BY MOUTH ONCE FOR 1 DOSE. MAY REPEAT IN 2 HOURS IF HEADACHE PERSISTS OR RECURS. 9 tablet 0   No current facility-administered medications on file prior to visit.    Allergies  Allergen Reactions   Erythromycin Base Diarrhea   Morphine And Related Nausea And Vomiting    And excessive sweating     Past Medical History:  Diagnosis Date   Allergy    with seasonal changes    Hemorrhoids    MVP (mitral valve prolapse)    Scoliosis    mild per pt    Vitamin B12 deficiency    oral therapy effective    Past Surgical History:  Procedure Laterality Date   APPENDECTOMY  2011   wrapped around ovary , saved Ovary-     [place]   x 2   FEMORAL HERNIA REPAIR  ~1978   left ?    Family History  Problem Relation Age of Onset   Colon polyps Mother    Heart disease Maternal Grandmother        died of sudden MI   Diabetes Other        very prevalent   Diabetes Paternal Uncle    Other Maternal Grandfather        4 tumors behind intestine but were benign, no cancer- used radiation to shrink intestines    Hyperthyroidism Nephew    Hyperthyroidism Maternal Uncle    Cancer Neg Hx    Colon cancer Neg Hx    Esophageal cancer Neg Hx    Stomach cancer Neg Hx    Rectal cancer Neg Hx     Social History   Socioeconomic History   Marital status: Single    Spouse name: Not on file   Number of children: 2   Years of education: Not on file   Highest education level: Not on file  Occupational History   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 [place]  Tobacco Use   Smoking status: Never   Smokeless tobacco: Never  Vaping Use   Vaping Use: Never used  Substance and Sexual Activity   Alcohol  use: No   Drug use: No   Sexual activity: Not on file  Other Topics Concern   Not on file  Social History Narrative   Not on file   Social Determinants of Health   Financial Resource Strain: Not on file  Food Insecurity: Not on file  Transportation Needs: Not on file  Physical Activity: Not on file  Stress: Not on file  Social Connections: Not on file  Intimate Partner Violence: Not on file   Review of Systems Has some apparent insect bites on both calves No SOB     Objective:   Physical Exam Constitutional:      Appearance: Normal appearance.  Cardiovascular:     Rate and Rhythm: Normal rate and regular rhythm.     Heart sounds: No murmur heard.   No gallop.  Pulmonary:     Effort: Pulmonary effort is normal.     Breath sounds: Normal breath sounds. No wheezing or rales.  Abdominal:     Palpations: Abdomen is soft.     Tenderness: There is no abdominal  tenderness.  Musculoskeletal:     Cervical back: Neck supple.     Comments: No back tenderness Non inflamed superficial varicosities bilaterally  Lymphadenopathy:     Cervical: No cervical adenopathy.  Skin:    Findings: No rash.     Comments: Bug bites on calves  Neurological:     Mental Status: She is alert.  Psychiatric:        Mood and Affect: Mood normal.        Behavior: Behavior normal.           Assessment & Plan:

## 2021-05-31 NOTE — Assessment & Plan Note (Signed)
Will give TAC for symptom relief

## 2021-05-31 NOTE — Assessment & Plan Note (Signed)
Brief bursts in ambulance and in ER Slight symptoms No previous problems Discussed options---will hold off on action now but set up with cardiologist if more symptoms (and start low dose metoprolol pending evaluation)

## 2021-05-31 NOTE — Assessment & Plan Note (Signed)
Probably related to bending, etc while helping mom Better now Has NSAIDs for prn use No further action

## 2021-06-06 ENCOUNTER — Other Ambulatory Visit: Payer: Self-pay | Admitting: Internal Medicine

## 2021-08-22 ENCOUNTER — Ambulatory Visit: Payer: 59 | Admitting: Internal Medicine

## 2021-09-03 ENCOUNTER — Other Ambulatory Visit: Payer: Self-pay | Admitting: Internal Medicine

## 2021-09-06 NOTE — Telephone Encounter (Signed)
Last refilled on 06/15/20 #28.35 g 3 RF LOV 05/31/21 ER follow up  Next appointment on 01/06/22

## 2021-09-14 ENCOUNTER — Encounter: Payer: Self-pay | Admitting: Internal Medicine

## 2021-09-14 ENCOUNTER — Other Ambulatory Visit: Payer: Self-pay

## 2021-09-14 ENCOUNTER — Ambulatory Visit (INDEPENDENT_AMBULATORY_CARE_PROVIDER_SITE_OTHER): Payer: 59 | Admitting: Internal Medicine

## 2021-09-14 DIAGNOSIS — N951 Menopausal and female climacteric states: Secondary | ICD-10-CM | POA: Insufficient documentation

## 2021-09-14 MED ORDER — NORGESTREL-ETHINYL ESTRADIOL 0.3-30 MG-MCG PO TABS: 1.0000 | ORAL_TABLET | Freq: Every day | ORAL | 11 refills | Status: DC

## 2021-09-14 NOTE — Progress Notes (Signed)
Subjective:    Patient ID: Erin Fuentes, female    DOB: [birth year], 50 y.o.   MRN: [id number]  HPI Here due to symptoms concerning for menopause "I need some guidance"  October 18th---having some right breast pains off and on Started cycle the next week---pain went away No period since then  Thanksgiving--had some bleeding hemorrhoids Finally got it stopped Doing baths with epsom salts and aloe on surface Witch hazel also Also preparation H cream Went on for 2 weeks--better now  Does get some intermittent right pelvic pain--relates to her ovary Varies from day to day  Getting hot at night--some sweats Dry mouth in the morning  Current Outpatient Medications on File Prior to Visit  Medication Sig Dispense Refill   cyanocobalamin 1000 MCG tablet Take 1,000 mcg by mouth every other day.     hydrocortisone 2.5 % cream APPLY TOPICALLY 3 (THREE) TIMES DAILY AS NEEDED. 28 g 3   Multiple Vitamin (MULTIVITAMIN) tablet Take 1 tablet by mouth daily.     Naproxen Sodium (ALEVE PO) Take by mouth as needed.     SUMAtriptan (IMITREX) 50 MG tablet TAKE 1 TABLET BY MOUTH ONCE FOR 1 DOSE. MAY REPEAT IN 2 HOURS IF HEADACHE PERSISTS OR RECURS. 9 tablet 11   triamcinolone cream (KENALOG) 0.1 % Apply 1 application topically 2 (two) times daily as needed. 45 g 1   No current facility-administered medications on file prior to visit.    Allergies  Allergen Reactions   Erythromycin Base Diarrhea   Morphine And Related Nausea And Vomiting    And excessive sweating     Past Medical History:  Diagnosis Date   Allergy    with seasonal changes    Hemorrhoids    MVP (mitral valve prolapse)    Scoliosis    mild per pt    Vitamin B12 deficiency    oral therapy effective    Past Surgical History:  Procedure Laterality Date   APPENDECTOMY  2011   wrapped around ovary , saved Ovary-    [place]   x 2   FEMORAL HERNIA REPAIR  ~1978   left ?    Family History   Problem Relation Age of Onset   Colon polyps Mother    Heart disease Maternal Grandmother        died of sudden MI   Diabetes Other        very prevalent   Diabetes Paternal Uncle    Other Maternal Grandfather        4 tumors behind intestine but were benign, no cancer- used radiation to shrink intestines    Hyperthyroidism Nephew    Hyperthyroidism Maternal Uncle    Cancer Neg Hx    Colon cancer Neg Hx    Esophageal cancer Neg Hx    Stomach cancer Neg Hx    Rectal cancer Neg Hx     Social History   Socioeconomic History   Marital status: Single    Spouse name: Not on file   Number of children: 2   Years of education: Not on file   Highest education level: Not on file  Occupational History   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 [employer]  Tobacco Use   Smoking status: Never   Smokeless tobacco: Never  Vaping Use   Vaping Use: Never used  Substance and Sexual Activity   Alcohol use: No   Drug use: No   Sexual activity: Not on file  Other Topics Concern   Not on file  Social History Narrative   Not on file   Social Determinants of Health   Financial Resource Strain: Not on file  Food Insecurity: Not on file  Transportation Needs: Not on file  Physical Activity: Not on file  Stress: Not on file  Social Connections: Not on file  Intimate Partner Violence: Not on file   Review of Systems No depression or emotional issues Hasn't missed work---makes herself go Migraines intermittent---sumatriptan aborts fine    Objective:   Physical Exam Constitutional:      Appearance: Normal appearance.  Neurological:     Mental Status: She is alert.  Psychiatric:        Mood and Affect: Mood normal.        Behavior: Behavior normal.           Assessment & Plan:

## 2021-09-14 NOTE — Assessment & Plan Note (Signed)
Perimenopausal Having sweats/flashes/breast tenderness, etc Discussed alternatives (hormonal with OCP for 1-2 years), venlafaxine, etc  Will go ahead with OCP---low dose estrogen She will fill me in with how she does

## 2021-09-14 NOTE — Patient Instructions (Signed)
Please schedule your screening mammogram. 

## 2021-10-28 ENCOUNTER — Emergency Department: Payer: 59

## 2021-10-28 ENCOUNTER — Other Ambulatory Visit: Payer: Self-pay

## 2021-10-28 ENCOUNTER — Emergency Department
Admission: EM | Admit: 2021-10-28 | Discharge: 2021-10-28 | Disposition: A | Discharge status: Home / Self Care | Payer: 59 | Attending: Emergency Medicine | Admitting: Emergency Medicine

## 2021-10-28 DIAGNOSIS — R0789 Other chest pain: Secondary | ICD-10-CM | POA: Insufficient documentation

## 2021-10-28 DIAGNOSIS — R079 Chest pain, unspecified: Secondary | ICD-10-CM | POA: Diagnosis present

## 2021-10-28 LAB — TROPONIN I (HIGH SENSITIVITY)
Troponin I (High Sensitivity): 3 ng/L (ref <18)
Troponin I (High Sensitivity): 5 ng/L (ref <18)

## 2021-10-28 LAB — CBC
HCT: 37.4 % (ref 36.0–46.0)
Hemoglobin: 13.3 g/dL (ref 12.0–15.0)
MCH: 32 pg (ref 26.0–34.0)
MCHC: 35.6 g/dL (ref 30.0–36.0)
MCV: 89.9 fL (ref 80.0–100.0)
Platelets: 318 10*3/uL (ref 150–400)
RBC: 4.16 MIL/uL (ref 3.87–5.11)
RDW: 12.1 % (ref 11.5–15.5)
WBC: 9.7 10*3/uL (ref 4.0–10.5)
nRBC: 0 % (ref 0.0–0.2)

## 2021-10-28 LAB — BASIC METABOLIC PANEL
Anion gap: 6 (ref 5–15)
BUN: 13 mg/dL (ref 6–20)
CO2: 24 mmol/L (ref 22–32)
Calcium: 8.8 mg/dL — ABNORMAL LOW (ref 8.9–10.3)
Chloride: 107 mmol/L (ref 98–111)
Creatinine, Ser: 0.9 mg/dL (ref 0.44–1.00)
GFR, Estimated: >60 mL/min (ref >60)
Glucose, Bld: 119 mg/dL — ABNORMAL HIGH (ref 70–99)
Potassium: 3.8 mmol/L (ref 3.5–5.1)
Sodium: 137 mmol/L (ref 135–145)

## 2021-10-28 LAB — D-DIMER, QUANTITATIVE: D-Dimer, Quant: 1.26 ug/mL-FEU — ABNORMAL HIGH (ref 0.00–0.50)

## 2021-10-28 MED ORDER — IOHEXOL 350 MG/ML SOLN
75.0000 mL | Freq: Once | INTRAVENOUS | Status: AC | PRN
  Administered 2021-10-28: 75 mL via INTRAVENOUS

## 2021-10-28 NOTE — ED Triage Notes (Signed)
Pt comes into the ED via EMS from home with c/o waking with "sharp pain in my heart" with tingling in the hands, ambulatory with steady gait, a/ox4  HR100-120 140/90 CBG125 100%RA

## 2021-10-28 NOTE — ED Provider Notes (Signed)
[facility] Provider Note    Event Date/Time   First MD Initiated Contact with Patient 10/28/21 0848     (approximate)   History   Chest Pain   HPI  Erin Fuentes is a 51 y.o. female who presents to the ED for evaluation of Chest Pain   I review outpatient PCP visit from 12/7.  Perimenopausal and started on OCPs.  Pt presents to the ED for evaluation of sharp chest pains since she awoke this morning. She reports intermittent pains, lasting up to 10 minutes, sharp in nature and throughout her left sided chest.  She reports it feels tender to palpation when she touches the area.  She reports concurrent sweatiness with the pain, but also acknowledges this may be due to hot flashes from being perimenopausal.  She reports no chest pain right now and reports feeling okay, just worried about why she might have a heart attack because her uncle and her mother had heart attacks in their 73s and 17s.  She has no personal history of CAD.   Physical Exam   Triage Vital Signs: ED Triage Vitals [10/28/21 0747]  Enc Vitals Group     BP (!) 142/77     Pulse Rate (!) 108     Resp 20     Temp 98.4 F (36.9 C)     Temp Source Oral     SpO2 100 %     Weight      Height      Head Circumference      Peak Flow      Pain Score      Pain Loc      Pain Edu?      Excl. in [place]?     Most recent vital signs: Vitals:   10/28/21 1000 10/28/21 1015  BP: 135/80   Pulse: 95 84  Resp: 16 15  Temp:    SpO2: 99% 100%    General: Awake, no distress.  Anxious, but pleasant and conversational in full sentences. CV:  Good peripheral perfusion.  Resp:  Normal effort.  Abd:  No distention.  MSK:  No deformity noted.  Neuro:  No focal deficits appreciated. Other:     ED Results / Procedures / Treatments   Labs (all labs ordered are listed, but only abnormal results are displayed) Labs Reviewed  BASIC METABOLIC PANEL - Abnormal; Notable for the following  components:      Result Value   Glucose, Bld 119 (*)    Calcium 8.8 (*)    All other components within normal limits  D-DIMER, QUANTITATIVE - Abnormal; Notable for the following components:   D-Dimer, Quant 1.26 (*)    All other components within normal limits  CBC  POC URINE PREG, ED  TROPONIN I (HIGH SENSITIVITY)  TROPONIN I (HIGH SENSITIVITY)    EKG Sinus tachycardia with a rate of 104 bpm.  Normal axis and intervals.  No evidence of acute ischemia.  RADIOLOGY CXR reviewed by me without evidence of acute cardiopulmonary pathology.  Official radiology report(s): DG Chest 2 View  Result Date: 10/28/2021 CLINICAL DATA:  Pain, woke with sharp pain in heart, diaphoresis, and tingling in the hands EXAM: CHEST - 2 VIEW COMPARISON:  05/20/2021 FINDINGS: Normal heart size, mediastinal contours, and pulmonary vascularity. Biapical scarring and chronic upper lobe volume loss. No acute infiltrate, pleural effusion, or pneumothorax. Biconvex thoracic scoliosis. IMPRESSION: Biapical scarring and chronic upper lobe volume loss. No acute abnormalities. Electronically Signed  By: Lavonia Dana M.D.   On: 10/28/2021 08:30   CT Angio Chest PE W and/or Wo Contrast  Result Date: 10/28/2021 CLINICAL DATA:  PE suspected, positive D-dimer, sharp chest pain radiating to left arm with slight shortness of breath EXAM: CT ANGIOGRAPHY CHEST WITH CONTRAST TECHNIQUE: Multidetector CT imaging of the chest was performed using the standard protocol during bolus administration of intravenous contrast. Multiplanar CT image reconstructions and MIPs were obtained to evaluate the vascular anatomy. RADIATION DOSE REDUCTION: This exam was performed according to the departmental dose-optimization program which includes automated exposure control, adjustment of the mA and/or kV according to patient size and/or use of iterative reconstruction technique. CONTRAST:  93mL OMNIPAQUE IOHEXOL 350 MG/ML SOLN COMPARISON:  03/08/2018  FINDINGS: Cardiovascular: Satisfactory opacification of the pulmonary arteries to the segmental level. No evidence of pulmonary embolism. Normal heart size. No pericardial effusion. Mediastinum/Nodes: No enlarged mediastinal, hilar, or axillary lymph nodes. Thyroid gland, trachea, and esophagus demonstrate no significant findings. Lungs/Pleura: Lungs are clear. No pleural effusion or pneumothorax. Upper Abdomen: No acute abnormality. Musculoskeletal: S shaped curvature of the thoracolumbar spine. No acute osseous abnormality. Review of the MIP images confirms the above findings. IMPRESSION: Negative for pulmonary embolism.  No acute process in the chest. Electronically Signed   By: Merilyn Baba M.D.   On: 10/28/2021 11:43    PROCEDURES and INTERVENTIONS:  Procedures  Medications  iohexol (OMNIPAQUE) 350 MG/ML injection 75 mL (75 mLs Intravenous Contrast Given 10/28/21 1122)     IMPRESSION / MDM / ASSESSMENT AND PLAN / ED COURSE  I reviewed the triage vital signs and the nursing notes.  Fairly low risk 51 year old female presents to the ED with atypical chest pains, without evidence of acute pathology and suitable for outpatient management.  Tachycardic in triage, but otherwise hemodynamically stable.  EKG is nonischemic and 2 troponins are negative.  Due to recently starting estrogen-containing OCPs, D-dimer was sent and has returned positive.  CTA chest without evidence of acute PE, or other acute cardiopulmonary pathology.  She is chest pain-free here in the ED.  I considered admission for observation considering her chest pain, but considering her benign work-up and her desire for discharge I think outpatient management is reasonable.  Return precautions for the ED discussed.      FINAL CLINICAL IMPRESSION(S) / ED DIAGNOSES   Final diagnoses:  Other chest pain     Rx / DC Orders   ED Discharge Orders     None        Note:  This document was prepared using Dragon voice  recognition software and may include unintentional dictation errors.   Vladimir Crofts, MD 10/28/21 [phone]

## 2021-10-28 NOTE — ED Notes (Signed)
See triage note, pt reports waking up at 0600 this morning with sharp chest pains radiating to left arm with slight shob. Denies n/v at this time. Denies chest pain at this time. NAD noted. Alert and oriented.  Call bell in reach and stretcher locked in lowest position.

## 2021-10-31 ENCOUNTER — Telehealth: Payer: Self-pay

## 2021-10-31 NOTE — Telephone Encounter (Signed)
Left message on VM per DPR and verified with pt's name to see how she was doing after recent ER visit.

## 2021-11-01 ENCOUNTER — Other Ambulatory Visit: Payer: Self-pay | Admitting: Internal Medicine

## 2021-11-01 DIAGNOSIS — Z1231 Encounter for screening mammogram for malignant neoplasm of breast: Secondary | ICD-10-CM

## 2021-12-07 ENCOUNTER — Ambulatory Visit
Admission: RE | Admit: 2021-12-07 | Discharge: 2021-12-07 | Disposition: A | Discharge status: Home / Self Care | Payer: 59 | Source: Ambulatory Visit | Attending: Internal Medicine | Admitting: Internal Medicine

## 2021-12-07 ENCOUNTER — Other Ambulatory Visit: Payer: Self-pay

## 2021-12-07 DIAGNOSIS — Z1231 Encounter for screening mammogram for malignant neoplasm of breast: Secondary | ICD-10-CM | POA: Insufficient documentation

## 2021-12-08 ENCOUNTER — Other Ambulatory Visit: Payer: Self-pay | Admitting: Internal Medicine

## 2021-12-08 DIAGNOSIS — N63 Unspecified lump in unspecified breast: Secondary | ICD-10-CM

## 2021-12-08 DIAGNOSIS — R928 Other abnormal and inconclusive findings on diagnostic imaging of breast: Secondary | ICD-10-CM

## 2021-12-13 ENCOUNTER — Ambulatory Visit
Admission: RE | Admit: 2021-12-13 | Discharge: 2021-12-13 | Disposition: A | Discharge status: Home / Self Care | Payer: 59 | Source: Ambulatory Visit | Attending: Internal Medicine | Admitting: Internal Medicine

## 2021-12-13 ENCOUNTER — Other Ambulatory Visit: Payer: Self-pay

## 2021-12-13 DIAGNOSIS — N63 Unspecified lump in unspecified breast: Secondary | ICD-10-CM | POA: Diagnosis present

## 2021-12-13 DIAGNOSIS — R928 Other abnormal and inconclusive findings on diagnostic imaging of breast: Secondary | ICD-10-CM

## 2022-01-06 ENCOUNTER — Encounter: Payer: 59 | Admitting: Internal Medicine

## 2022-01-18 ENCOUNTER — Ambulatory Visit (INDEPENDENT_AMBULATORY_CARE_PROVIDER_SITE_OTHER): Payer: 59 | Admitting: Internal Medicine

## 2022-01-18 ENCOUNTER — Encounter: Payer: Self-pay | Admitting: Internal Medicine

## 2022-01-18 DIAGNOSIS — Z0000 Encounter for general adult medical examination without abnormal findings: Secondary | ICD-10-CM | POA: Diagnosis not present

## 2022-01-18 DIAGNOSIS — G43011 Migraine without aura, intractable, with status migrainosus: Secondary | ICD-10-CM | POA: Diagnosis not present

## 2022-01-18 DIAGNOSIS — N951 Menopausal and female climacteric states: Secondary | ICD-10-CM | POA: Diagnosis not present

## 2022-01-18 DIAGNOSIS — I872 Venous insufficiency (chronic) (peripheral): Secondary | ICD-10-CM

## 2022-01-18 NOTE — Assessment & Plan Note (Signed)
Better on the OCP ?Discussed---we will continue for 1-2 more years, then consider stopping or changing to lower dose post menopausal hormone Rx ?

## 2022-01-18 NOTE — Assessment & Plan Note (Signed)
Healthy ?Discussed exercise ?Urged her to get COVID and flu vaccines this fall ?She will consider shingrix ?Colon due 2026 ?Just had mammogram and follow up---due in 1-2 years ago ?Asks to defer pap again (due 2022)---HPV negative and abstinent (will defer to next year) ?

## 2022-01-18 NOTE — Assessment & Plan Note (Signed)
No sig pain ?Does wear support hose for work (when extended standing) ?

## 2022-01-18 NOTE — Assessment & Plan Note (Signed)
Sumatriptan effective for aborting ?

## 2022-01-18 NOTE — Progress Notes (Signed)
? ?Subjective:  ? ? Patient ID: Erin Fuentes, female    DOB: 07/16/1971, 51 y.o.   MRN: [id number] ? ?HPI ?Here for physical ? ?Menopausal symptoms are better on the OCP ?Now periods regular with the end of cycle with OCP ? ?Back is better ?Does get some RLL/pelvic pain comes back at times ?Nothing too bad (she calls it her ovary--but could be related to past C-sections) ? ?Occasional migraines---uses the imitrex with success ? ?Current Outpatient Medications on File Prior to Visit  ?Medication Sig Dispense Refill  ? cyanocobalamin 1000 MCG tablet Take 1,000 mcg by mouth every other day.    ? hydrocortisone 2.5 % cream APPLY TOPICALLY 3 (THREE) TIMES DAILY AS NEEDED. 28 g 3  ? Multiple Vitamin (MULTIVITAMIN) tablet Take 1 tablet by mouth daily.    ? Naproxen Sodium (ALEVE PO) Take by mouth as needed.    ? norgestrel-ethinyl estradiol (LO/OVRAL) 0.3-30 MG-MCG tablet Take 1 tablet by mouth daily. 28 tablet 11  ? SUMAtriptan (IMITREX) 50 MG tablet TAKE 1 TABLET BY MOUTH ONCE FOR 1 DOSE. MAY REPEAT IN 2 HOURS IF HEADACHE PERSISTS OR RECURS. 9 tablet 11  ? triamcinolone cream (KENALOG) 0.1 % Apply 1 application topically 2 (two) times daily as needed. 45 g 1  ? ?No current facility-administered medications on file prior to visit.  ? ? ?Allergies  ?Allergen Reactions  ? Erythromycin Base Diarrhea  ? Morphine And Related Nausea And Vomiting  ?  And excessive sweating   ? ? ?Past Medical History:  ?Diagnosis Date  ? Allergy   ? with seasonal changes   ? Hemorrhoids   ? MVP (mitral valve prolapse)   ? Scoliosis   ? mild per pt   ? Vitamin B12 deficiency   ? oral therapy effective  ? ? ?Past Surgical History:  ?Procedure Laterality Date  ? APPENDECTOMY  2011  ? wrapped around ovary , saved Ovary-   ? [place]  ? x 2  ? FEMORAL HERNIA REPAIR  ~1978  ? left ?  ? ? ?Family History  ?Problem Relation Age of Onset  ? Colon polyps Mother   ? Hyperthyroidism Maternal Uncle   ? Coronary artery disease Maternal  Uncle   ?     LAD stent  ? Diabetes Paternal Uncle   ? Heart disease Maternal Grandmother   ?     died of sudden MI  ? Other Maternal Grandfather   ?     4 tumors behind intestine but were benign, no cancer- used radiation to shrink intestines   ? Hyperthyroidism Nephew   ? Diabetes Other   ?     very prevalent  ? Cancer Neg Hx   ? Colon cancer Neg Hx   ? Esophageal cancer Neg Hx   ? Stomach cancer Neg Hx   ? Rectal cancer Neg Hx   ? ? ?Social History  ? ?Socioeconomic History  ? Marital status: Single  ?  Spouse name: Not on file  ? Number of children: 2  ? Years of education: Not on file  ? Highest education level: Not on file  ?Occupational History  ? 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 Smoking status: Never  ? Smokeless tobacco: Never  ?Vaping Use  ? Vaping Use: Never used  ?Substance and Sexual Activity  ? Alcohol use: No  ? Drug use: No  ? Sexual activity: Not on file  ?Other Topics  Concern  ? Not on file  ?Social History Narrative  ? Not on file  ? ?Social Determinants of Health  ? ?Financial Resource Strain: Not on file  ?Food Insecurity: Not on file  ?Transportation Needs: Not on file  ?Physical Activity: Not on file  ?Stress: Not on file  ?Social Connections: Not on file  ?Intimate Partner Violence: Not on file  ? ?Review of Systems  ?Constitutional:  Negative for fatigue and unexpected weight change.  ?     Walks at home ?Wears seat belt  ?HENT:  Negative for trouble swallowing.   ?Eyes:  Negative for visual disturbance.  ?     No diplopia or unilateral vision loss  ?Respiratory:  Negative for cough, chest tightness and shortness of breath.   ?Cardiovascular:  Positive for leg swelling.  ?     Has been wearing the compression stockings ?Does get visible veins in calves--no pain ?Aware of heart with stress at times (pain, palps----better with deep breaths)  ?Gastrointestinal:  Negative for blood in stool and constipation.  ?     Rare heartburn--tums helps   ?Endocrine: Negative for polydipsia and polyuria.  ?Genitourinary:  Negative for dysuria and hematuria.  ?     Abstinent for 20 years  ?Musculoskeletal:  Negative for arthralgias, back pain and joint swelling.  ?Skin:  Negative for rash.  ?     Skin tags around neck itch at times  ?Allergic/Immunologic: Positive for environmental allergies. Negative for immunocompromised state.  ?     Usually doesn't use meds  ?Neurological:  Positive for headaches. Negative for dizziness, syncope and light-headedness.  ?Hematological:  Negative for adenopathy. Does not bruise/bleed easily.  ?Psychiatric/Behavioral:  Negative for dysphoric mood and sleep disturbance. The patient is not nervous/anxious.   ? ?   ?Objective:  ? Physical Exam ?Constitutional:   ?   Appearance: Normal appearance.  ?HENT:  ?   Mouth/Throat:  ?   Pharynx: No oropharyngeal exudate or posterior oropharyngeal erythema.  ?Eyes:  ?   Conjunctiva/sclera: Conjunctivae normal.  ?   Pupils: Pupils are equal, round, and reactive to light.  ?Cardiovascular:  ?   Rate and Rhythm: Normal rate and regular rhythm.  ?   Pulses: Normal pulses.  ?   Heart sounds: No murmur heard. ?  No gallop.  ?Pulmonary:  ?   Effort: Pulmonary effort is normal.  ?   Breath sounds: Normal breath sounds. No wheezing or rales.  ?Abdominal:  ?   Palpations: Abdomen is soft.  ?   Tenderness: There is no abdominal tenderness.  ?Musculoskeletal:  ?   Cervical back: Neck supple.  ?   Right lower leg: No edema.  ?   Left lower leg: No edema.  ?   Comments: Several dilated superficial varicosities in both legs  ?Lymphadenopathy:  ?   Cervical: No cervical adenopathy.  ?Skin: ?   Findings: No lesion or rash.  ?Neurological:  ?   General: No focal deficit present.  ?   Mental Status: She is alert and oriented to person, place, and time.  ?Psychiatric:     ?   Mood and Affect: Mood normal.     ?   Behavior: Behavior normal.  ?  ? ? ? ? ?   ?Assessment & Plan:  ? ?

## 2022-03-02 ENCOUNTER — Telehealth: Payer: Self-pay | Admitting: Internal Medicine

## 2022-03-02 MED ORDER — NORGESTREL-ETHINYL ESTRADIOL 0.3-30 MG-MCG PO TABS: 1.0000 | ORAL_TABLET | Freq: Every day | ORAL | 11 refills | Status: DC

## 2022-03-02 NOTE — Telephone Encounter (Signed)
Rx sent electronically.  

## 2022-03-02 NOTE — Telephone Encounter (Signed)
Caller Name: Anjoli Diemer Call back phone #: [phone]  MEDICATION(S): norgestrel-ethinyl estradiol (LO/OVRAL) 0.3-30 MG-MCG tablet   Days of Med Remaining: none  Has the patient contacted their pharmacy (YES/NO)?  Yes, shes switching pharmacies because of insurance and was told to call for this one IF YES, when and what did the pharmacy advise?  IF NO, request that the patient contact the pharmacy for the refills in the future.             The pharmacy will send an electronic request (except for controlled medications).  Preferred Pharmacy: [facility] in [place]  ~~~Please advise patient/caregiver to allow 2-3 business days to process RX refills.

## 2022-05-25 ENCOUNTER — Ambulatory Visit
Admission: RE | Admit: 2022-05-25 | Discharge: 2022-05-25 | Disposition: A | Discharge status: Home / Self Care | Payer: 59 | Source: Ambulatory Visit | Attending: Family Medicine | Admitting: Family Medicine

## 2022-05-25 ENCOUNTER — Ambulatory Visit (INDEPENDENT_AMBULATORY_CARE_PROVIDER_SITE_OTHER): Payer: 59 | Admitting: Family Medicine

## 2022-05-25 ENCOUNTER — Encounter: Payer: Self-pay | Admitting: Family Medicine

## 2022-05-25 VITALS — BP 122/80 | HR 110 | Ht 66.0 in | Wt 173.0 lb

## 2022-05-25 DIAGNOSIS — M549 Dorsalgia, unspecified: Secondary | ICD-10-CM | POA: Diagnosis not present

## 2022-05-25 DIAGNOSIS — M79605 Pain in left leg: Secondary | ICD-10-CM | POA: Insufficient documentation

## 2022-05-25 MED ORDER — DICLOFENAC SODIUM 75 MG PO TBEC: 75.0000 mg | DELAYED_RELEASE_TABLET | Freq: Two times a day (BID) | ORAL | 0 refills | Status: DC

## 2022-05-25 MED ORDER — TRAMADOL HCL 50 MG PO TABS: 50.0000 mg | ORAL_TABLET | Freq: Three times a day (TID) | ORAL | 0 refills | Status: AC | PRN

## 2022-05-25 MED ORDER — CYCLOBENZAPRINE HCL 10 MG PO TABS: 10.0000 mg | ORAL_TABLET | Freq: Every evening | ORAL | 0 refills | Status: DC | PRN

## 2022-05-25 NOTE — Progress Notes (Signed)
Patient ID: Erin Fuentes, female    DOB: 1971/01/02, 51 y.o.   MRN: [id number]  This visit was conducted in person.  BP 122/80   Pulse (!) 110   Ht '5\' 6"'$  (1.676 m)   Wt 173 lb (78.5 kg)   SpO2 98%   BMI 27.92 kg/m    CC:  Chief Complaint  Patient presents with   Shoulder Pain    Having pain in shoulder sharp , started this morning , pain will not go away , having neck , having numbness and tingle in hands, notice a knots in veins     Subjective:   HPI: Erin Fuentes is a 51 y.o. female  patient of Dr. Alla German presenting on 05/25/2022 for Shoulder Pain (Having pain in shoulder sharp , started this morning , pain will not go away , having neck , having numbness and tingle in hands, notice a knots in veins )   She was feeling well , no issues until 3 Am this morning. Sudden pain under right shoulder blade, stiffness and soreness in right neck and upper shoulder.  Increased pain with moving head. Minimal pain with moving arm, no anterior shoulder pain  Using heat, ice, ibuprofen 200 mg x 1.. no improvement   No SOB,  no chest pain.  She has been having soreness in left calf.. veins are sore and raised .  Mils left leg swelling.    No history of back surgery.   No fall known.  Relevant past medical, surgical, family and social history reviewed and updated as indicated. Interim medical history since our last visit reviewed. Allergies and medications reviewed and updated. Outpatient Medications Prior to Visit  Medication Sig Dispense Refill   cyanocobalamin 1000 MCG tablet Take 1,000 mcg by mouth every other day.     hydrocortisone 2.5 % cream APPLY TOPICALLY 3 (THREE) TIMES DAILY AS NEEDED. 28 g 3   Multiple Vitamin (MULTIVITAMIN) tablet Take 1 tablet by mouth daily.     Naproxen Sodium (ALEVE PO) Take by mouth as needed.     norgestrel-ethinyl estradiol (LO/OVRAL) 0.3-30 MG-MCG tablet Take 1 tablet by mouth daily. 28 tablet 11   SUMAtriptan (IMITREX) 50 MG tablet  TAKE 1 TABLET BY MOUTH ONCE FOR 1 DOSE. MAY REPEAT IN 2 HOURS IF HEADACHE PERSISTS OR RECURS. 9 tablet 11   triamcinolone cream (KENALOG) 0.1 % Apply 1 application topically 2 (two) times daily as needed. 45 g 1   No facility-administered medications prior to visit.     Per HPI unless specifically indicated in ROS section below Review of Systems  Constitutional:  Negative for fatigue and fever.  HENT:  Negative for congestion.   Eyes:  Negative for pain.  Respiratory:  Negative for cough and shortness of breath.   Cardiovascular:  Positive for leg swelling. Negative for chest pain and palpitations.  Gastrointestinal:  Negative for abdominal pain.  Genitourinary:  Negative for dysuria and vaginal bleeding.  Musculoskeletal:  Negative for back pain.  Neurological:  Negative for syncope, light-headedness and headaches.  Psychiatric/Behavioral:  Negative for dysphoric mood.    Objective:  BP 122/80   Pulse (!) 110   Ht '5\' 6"'$  (1.676 m)   Wt 173 lb (78.5 kg)   SpO2 98%   BMI 27.92 kg/m   Wt Readings from Last 3 Encounters:  05/25/22 173 lb (78.5 kg)  01/18/22 170 lb 9.6 oz (77.4 kg)  09/14/21 173 lb (78.5 kg)  Physical Exam Constitutional:      General: She is not in acute distress.    Appearance: Normal appearance. She is well-developed. She is not ill-appearing or toxic-appearing.  HENT:     Head: Normocephalic.     Right Ear: Hearing, tympanic membrane, ear canal and external ear normal. Tympanic membrane is not erythematous, retracted or bulging.     Left Ear: Hearing, tympanic membrane, ear canal and external ear normal. Tympanic membrane is not erythematous, retracted or bulging.     Nose: No mucosal edema or rhinorrhea.     Right Sinus: No maxillary sinus tenderness or frontal sinus tenderness.     Left Sinus: No maxillary sinus tenderness or frontal sinus tenderness.     Mouth/Throat:     Pharynx: Uvula midline.  Eyes:     General: Lids are normal. Lids are  everted, no foreign bodies appreciated.     Conjunctiva/sclera: Conjunctivae normal.     Pupils: Pupils are equal, round, and reactive to light.  Neck:     Thyroid: No thyroid mass or thyromegaly.     Vascular: No carotid bruit.     Trachea: Trachea normal.  Cardiovascular:     Rate and Rhythm: Normal rate and regular rhythm.     Pulses: Normal pulses.     Heart sounds: Normal heart sounds, S1 normal and S2 normal. No murmur heard.    No friction rub. No gallop.  Pulmonary:     Effort: Pulmonary effort is normal. No tachypnea or respiratory distress.     Breath sounds: Normal breath sounds. No decreased breath sounds, wheezing, rhonchi or rales.  Abdominal:     General: Bowel sounds are normal.     Palpations: Abdomen is soft.     Tenderness: There is no abdominal tenderness.  Musculoskeletal:     Cervical back: Neck supple. Spasms and torticollis present. No bony tenderness. Pain with movement present. Decreased range of motion.     Thoracic back: Spasms and tenderness present. No bony tenderness. Decreased range of motion.     Lumbar back: Normal.  Skin:    General: Skin is warm and dry.     Findings: No rash.     Comments: Left lower leg with tortuous veins, slightly re, swollen and sore to palpation, mild swelling in left leg  Neurological:     Mental Status: She is alert.  Psychiatric:        Mood and Affect: Mood is not anxious or depressed.        Speech: Speech normal.        Behavior: Behavior normal. Behavior is cooperative.        Thought Content: Thought content normal.        Judgment: Judgment normal.       Results for orders placed or performed during the hospital encounter of 03/50/09  Basic metabolic panel  Result Value Ref Range   Sodium 137 135 - 145 mmol/L   Potassium 3.8 3.5 - 5.1 mmol/L   Chloride 107 98 - 111 mmol/L   CO2 24 22 - 32 mmol/L   Glucose, Bld 119 (H) 70 - 99 mg/dL   BUN 13 6 - 20 mg/dL   Creatinine, Ser 0.90 0.44 - 1.00 mg/dL    Calcium 8.8 (L) 8.9 - 10.3 mg/dL   GFR, Estimated >60 >60 mL/min   Anion gap 6 5 - 15  CBC  Result Value Ref Range   WBC 9.7 4.0 - 10.5 K/uL   RBC  4.16 3.87 - 5.11 MIL/uL   Hemoglobin 13.3 12.0 - 15.0 g/dL   HCT 37.4 36.0 - 46.0 %   MCV 89.9 80.0 - 100.0 fL   MCH 32.0 26.0 - 34.0 pg   MCHC 35.6 30.0 - 36.0 g/dL   RDW 12.1 11.5 - 15.5 %   Platelets 318 150 - 400 K/uL   nRBC 0.0 0.0 - 0.2 %  D-dimer, quantitative  Result Value Ref Range   D-Dimer, Quant 1.26 (H) 0.00 - 0.50 ug/mL-FEU  Troponin I (High Sensitivity)  Result Value Ref Range   Troponin I (High Sensitivity) 3 <18 ng/L  Troponin I (High Sensitivity)  Result Value Ref Range   Troponin I (High Sensitivity) 5 <18 ng/L     COVID 19 screen:  No recent travel or known exposure to COVID19 The patient denies respiratory symptoms of COVID 19 at this time. The importance of social distancing was discussed today.   Assessment and Plan Problem List Items Addressed This Visit     Left leg pain    Acute, appearance of her left leg is most consistent with superficial phlebitis.  This has been going on for a few days prior to her other symptoms.  Only mild swelling.  DVT less likely but I will send her for ultrasound of her leg to make sure we are not missing this.  Treat with heat elevation and nonsteroidal anti-inflammatories      Relevant Orders   US Venous Img Lower Unilateral Left (DVT)   Upper back pain on right side - Primary    Acute, pain most likely due to musculoskeletal spasm.  She has increased pain with movement.  We will treat with nonsteroidal anti-inflammatories heat, gentle stretching and muscle relaxant.  She will use tramadol for breakthrough pain.  Given her leg pain there is a small unlikely chance that she could have a clot in her leg, so I have reviewed ER precautions including chest pain and shortness of breath.      Relevant Medications   diclofenac (VOLTAREN) 75 MG EC tablet   cyclobenzaprine  (FLEXERIL) 10 MG tablet   traMADol (ULTRAM) 50 MG tablet   Meds ordered this encounter  Medications   diclofenac (VOLTAREN) 75 MG EC tablet    Sig: Take 1 tablet (75 mg total) by mouth 2 (two) times daily.    Dispense:  30 tablet    Refill:  0   cyclobenzaprine (FLEXERIL) 10 MG tablet    Sig: Take 1 tablet (10 mg total) by mouth at bedtime as needed for muscle spasms.    Dispense:  15 tablet    Refill:  0   traMADol (ULTRAM) 50 MG tablet    Sig: Take 1 tablet (50 mg total) by mouth every 8 (eight) hours as needed for up to 5 days.    Dispense:  15 tablet    Refill:  0   Orders Placed This Encounter  Procedures   US Venous Img Lower Unilateral Left (DVT)    Call back [phone], Call [phone] after 5 PM    Standing Status:   Future    Standing Expiration Date:   05/26/2023    Order Specific Question:   Reason for Exam (SYMPTOM  OR DIAGNOSIS REQUIRED)    Answer:   left leg pain, rule out DVT, possible superficial phlebitis    Order Specific Question:   Preferred imaging location?    Answer:   [facility],  MD

## 2022-05-25 NOTE — Assessment & Plan Note (Signed)
Acute, appearance of her left leg is most consistent with superficial phlebitis.  This has been going on for a few days prior to her other symptoms.  Only mild swelling.  DVT less likely but I will send her for ultrasound of her leg to make sure we are not missing this.  Treat with heat elevation and nonsteroidal anti-inflammatories

## 2022-05-25 NOTE — Assessment & Plan Note (Signed)
Acute, pain most likely due to musculoskeletal spasm.  She has increased pain with movement.  We will treat with nonsteroidal anti-inflammatories heat, gentle stretching and muscle relaxant.  She will use tramadol for breakthrough pain.  Given her leg pain there is a small unlikely chance that she could have a clot in her leg, so I have reviewed ER precautions including chest pain and shortness of breath.

## 2022-05-25 NOTE — Patient Instructions (Addendum)
Start anti-inflammatory and muscle relaxant. Can use tramadol for breakthrough pain.  Start heat and gentle stretching on upper back   We will set up [place] to evaluate left leg pain.  Can apply heat and elevated left leg.  If you have severe chest pain and shortness of breath.. go ER.

## 2022-06-06 ENCOUNTER — Other Ambulatory Visit: Payer: Self-pay | Admitting: Family Medicine

## 2022-06-06 NOTE — Telephone Encounter (Signed)
Last office visit 05/25/22 with Dr. Diona Browner for upper back pain and left leg pain . Last refilled 05/25/22 for #30 with no refills.  CPE scheduled with Dr. Silvio Pate 01/24/2023.

## 2022-10-12 ENCOUNTER — Encounter: Payer: Self-pay | Admitting: Internal Medicine

## 2023-01-24 ENCOUNTER — Encounter: Payer: 59 | Admitting: Internal Medicine

## 2023-02-13 ENCOUNTER — Other Ambulatory Visit: Payer: Self-pay | Admitting: Internal Medicine

## 2023-02-21 ENCOUNTER — Encounter: Payer: 59 | Admitting: Internal Medicine

## 2023-03-04 IMAGING — CR DG CHEST 2V
2 series · 2 of 2 positions shown · non-contrast
Comparison: CTA chest, Abdomen, and Pelvis 03/08/2018

CLINICAL DATA: 50-year-old female with tachycardia. Back pain. SVT.

EXAM:
CHEST - 2 VIEW

[chest pa]
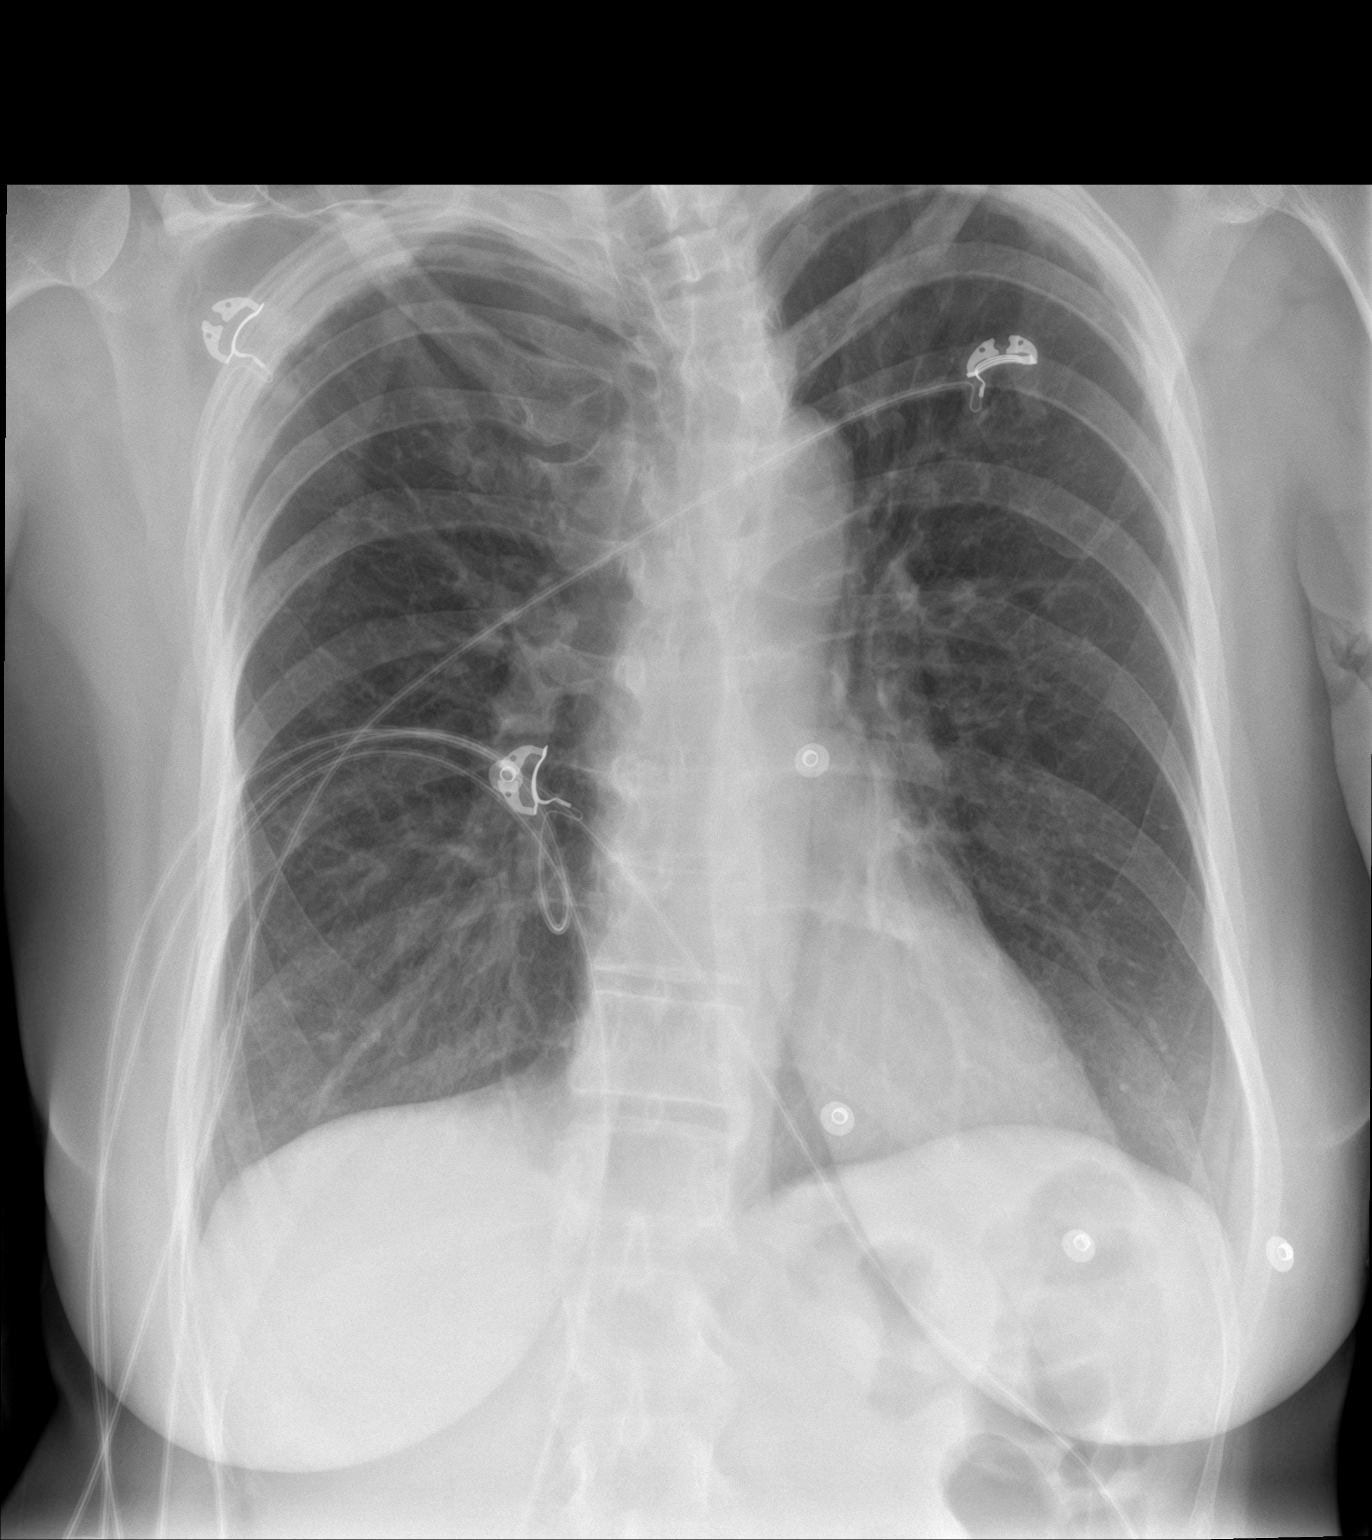

[chest lat]
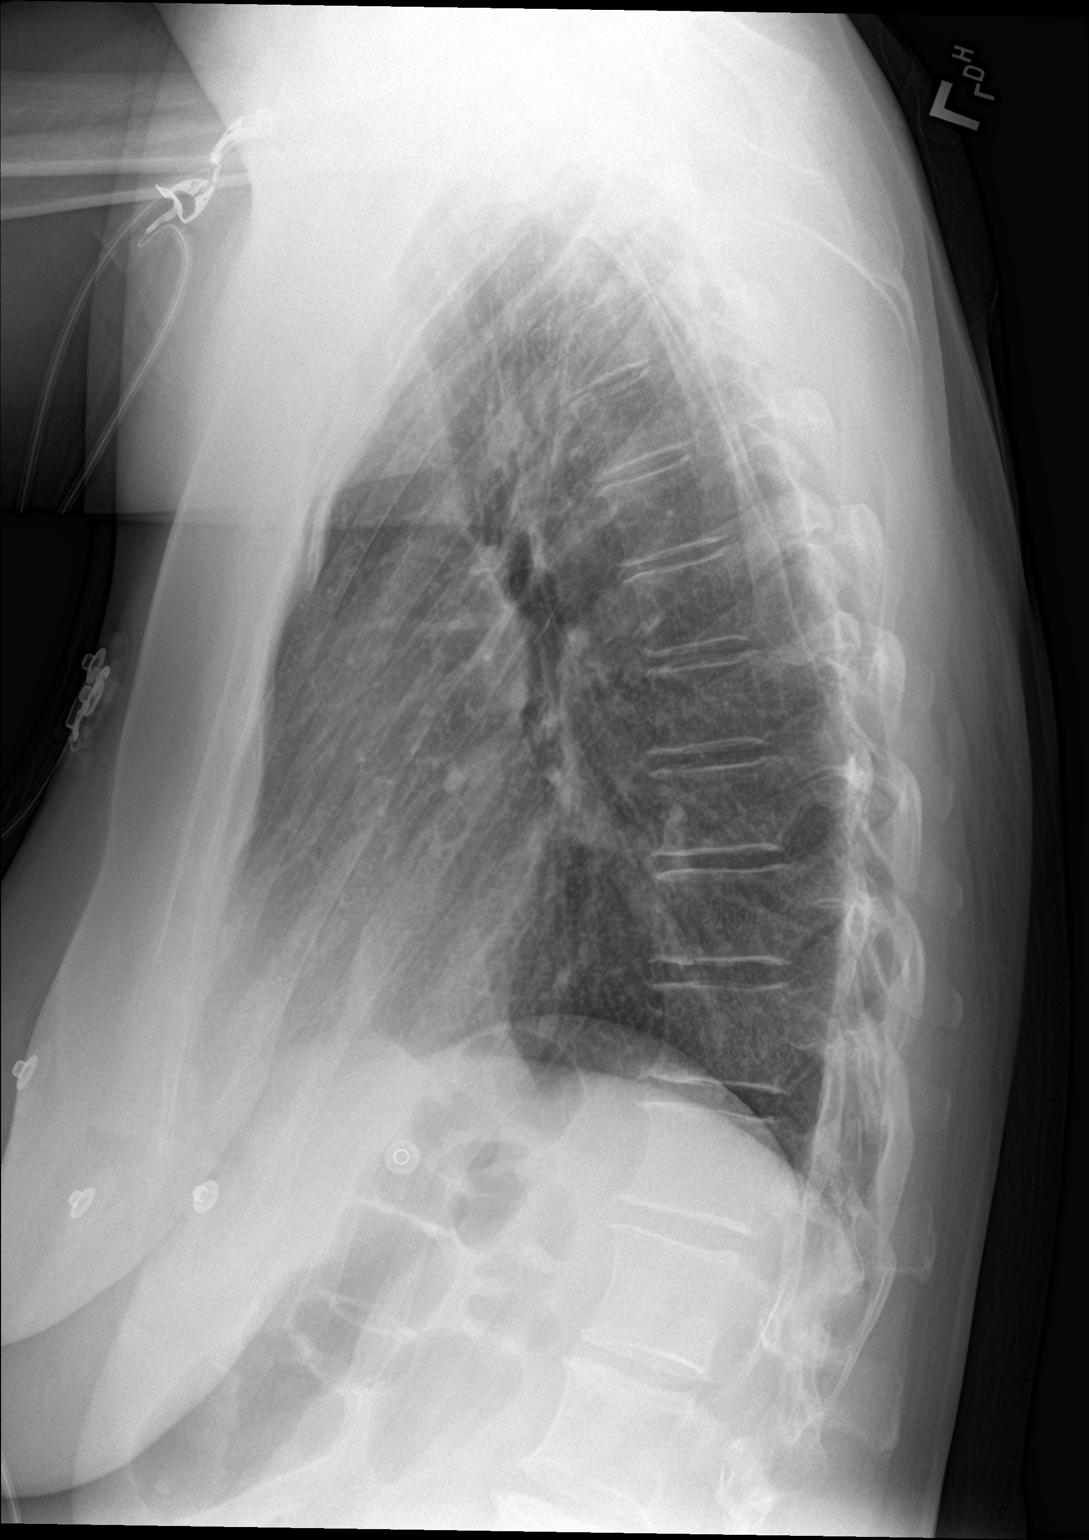

[2 of 2 positions shown; findings below may reference images not displayed]

FINDINGS: PA and lateral views. Chronic upper thoracic levoconvex scoliosis.
Normal lung volumes and mediastinal contours. Visualized tracheal
air column is within normal limits. Evidence of some chronic
biapical lung scarring, but otherwise both lungs appear clear. No
pneumothorax or pleural effusion.

No acute osseous abnormality identified. Negative visible bowel gas
pattern.
IMPRESSION: No acute cardiopulmonary abnormality.

## 2023-03-22 ENCOUNTER — Encounter: Payer: 59 | Admitting: Internal Medicine

## 2023-04-26 ENCOUNTER — Encounter: Payer: 59 | Admitting: Internal Medicine

## 2023-05-09 ENCOUNTER — Encounter: Payer: 59 | Admitting: Internal Medicine

## 2023-05-24 ENCOUNTER — Ambulatory Visit (INDEPENDENT_AMBULATORY_CARE_PROVIDER_SITE_OTHER): Payer: 59 | Admitting: Internal Medicine

## 2023-05-24 ENCOUNTER — Encounter: Payer: Self-pay | Admitting: Internal Medicine

## 2023-05-24 ENCOUNTER — Other Ambulatory Visit (HOSPITAL_COMMUNITY)
Admission: RE | Admit: 2023-05-24 | Discharge: 2023-05-24 | Disposition: A | Discharge status: Home / Self Care | Payer: 59 | Source: Ambulatory Visit | Attending: Internal Medicine | Admitting: Internal Medicine

## 2023-05-24 VITALS — BP 126/84 | HR 89 | Temp 98.1°F | Ht 66.0 in | Wt 181.0 lb

## 2023-05-24 DIAGNOSIS — Z0000 Encounter for general adult medical examination without abnormal findings: Secondary | ICD-10-CM | POA: Insufficient documentation

## 2023-05-24 DIAGNOSIS — G43011 Migraine without aura, intractable, with status migrainosus: Secondary | ICD-10-CM

## 2023-05-24 DIAGNOSIS — I471 Supraventricular tachycardia, unspecified: Secondary | ICD-10-CM | POA: Diagnosis not present

## 2023-05-24 DIAGNOSIS — N951 Menopausal and female climacteric states: Secondary | ICD-10-CM | POA: Diagnosis not present

## 2023-05-24 MED ORDER — HYDROCORTISONE 2.5 % EX CREA: TOPICAL_CREAM | Freq: Three times a day (TID) | CUTANEOUS | 3 refills | Status: AC | PRN

## 2023-05-24 MED ORDER — CYCLOBENZAPRINE HCL 10 MG PO TABS: 10.0000 mg | ORAL_TABLET | Freq: Every evening | ORAL | 0 refills | Status: DC | PRN

## 2023-05-24 MED ORDER — PROGESTERONE MICRONIZED 100 MG PO CAPS: 100.0000 mg | ORAL_CAPSULE | Freq: Every day | ORAL | 3 refills | Status: DC

## 2023-05-24 MED ORDER — TRIAMCINOLONE ACETONIDE 0.1 % EX CREA: 1.0000 | TOPICAL_CREAM | Freq: Two times a day (BID) | CUTANEOUS | 1 refills | Status: AC | PRN

## 2023-05-24 MED ORDER — SUMATRIPTAN SUCCINATE 50 MG PO TABS: 50.0000 mg | ORAL_TABLET | ORAL | 11 refills | Status: DC | PRN

## 2023-05-24 MED ORDER — ESTRADIOL 0.5 MG PO TABS: 0.5000 mg | ORAL_TABLET | Freq: Every day | ORAL | 3 refills | Status: DC

## 2023-05-24 NOTE — Assessment & Plan Note (Signed)
Healthy Prefers no immunizations Colon due 2026 Pap done today Mammogram due by next spring---any time now exercise

## 2023-05-24 NOTE — Assessment & Plan Note (Signed)
Will check labs Rare mild symptoms

## 2023-05-24 NOTE — Assessment & Plan Note (Signed)
Discussed hormonal therapy vs venlafaxine Will try estradiol/progesterone

## 2023-05-24 NOTE — Assessment & Plan Note (Signed)
Less often now

## 2023-05-24 NOTE — Addendum Note (Signed)
Addended by: Eual Fines on: 05/24/2023 05:03 PM   Modules accepted: Orders

## 2023-05-24 NOTE — Progress Notes (Signed)
Subjective:    Patient ID: Erin Fuentes, female    DOB: 1970-11-13, 52 y.o.   MRN: [id number]  HPI Here for physical  Can't sleep---feels it is menopause Hot all the time, only sleeps 3-4 hours at a time Periods stopped Ran out of oral contraceptive  Gets some pain behind her left ear Notes in evenings No ringing or ear discharge  Current Outpatient Medications on File Prior to Visit  Medication Sig Dispense Refill   cyanocobalamin 1000 MCG tablet Take 1,000 mcg by mouth every other day.     hydrocortisone 2.5 % cream APPLY TOPICALLY 3 (THREE) TIMES DAILY AS NEEDED. 28 g 3   Multiple Vitamin (MULTIVITAMIN) tablet Take 1 tablet by mouth daily.     SUMAtriptan (IMITREX) 50 MG tablet TAKE 1 TABLET BY MOUTH ONCE FOR 1 DOSE. MAY REPEAT IN 2 HOURS IF HEADACHE PERSISTS OR RECURS. 9 tablet 11   triamcinolone cream (KENALOG) 0.1 % Apply 1 application topically 2 (two) times daily as needed. 45 g 1   No current facility-administered medications on file prior to visit.    Allergies  Allergen Reactions   Erythromycin Base Diarrhea   Morphine And Codeine Nausea And Vomiting    And excessive sweating     Past Medical History:  Diagnosis Date   Allergy    with seasonal changes    Hemorrhoids    MVP (mitral valve prolapse)    Scoliosis    mild per pt    Vitamin B12 deficiency    oral therapy effective    Past Surgical History:  Procedure Laterality Date   APPENDECTOMY  2011   wrapped around ovary , saved Ovary-    CESAREAN SECTION  1993, 1996   x 2   FEMORAL HERNIA REPAIR  ~1978   left ?    Family History  Problem Relation Age of Onset   Colon polyps Mother    Ovarian cancer Mother        caught early   Hyperthyroidism Maternal Uncle    Coronary artery disease Maternal Uncle        LAD stent   Diabetes Paternal Uncle    Heart disease Maternal Grandmother        died of sudden MI   Other Maternal Grandfather        4 tumors behind intestine but were benign,  no cancer- used radiation to shrink intestines    Hyperthyroidism Nephew    Diabetes Other        very prevalent   Cancer Neg Hx    Colon cancer Neg Hx    Esophageal cancer Neg Hx    Stomach cancer Neg Hx    Rectal cancer Neg Hx     Social History   Socioeconomic History   Marital status: Divorced    Spouse name: Not on file   Number of children: 2   Years of education: Not on file   Highest education level: Not on file  Occupational History   Occupation: Social research officer, 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[place]  Tobacco Use   Smoking status: Never   Smokeless tobacco: Never  Vaping Use   Vaping status: Never Used  Substance and Sexual Activity   Alcohol use: No   Drug use: No   Sexual activity: Not on file  Other Topics Concern   Not on file  Social History Narrative   Not on file   Social Determinants of Health   Financial Resource Strain: Not on  file  Food Insecurity: Not on file  Transportation Needs: Not on file  Physical Activity: Not on file  Stress: Not on file  Social Connections: Not on file  Intimate Partner Violence: Not on file   Review of Systems  Constitutional:  Negative for fatigue and unexpected weight change.       Wears seat belt  HENT:  Negative for dental problem, hearing loss and tinnitus.        Due for dentist  Eyes:  Negative for visual disturbance.       No diplopia or unilateral vision loss  Respiratory:  Negative for cough, chest tightness and shortness of breath.   Cardiovascular:  Negative for chest pain and leg swelling.       Occ palpitations--feels like it is stress  Gastrointestinal:  Negative for blood in stool and constipation.       No heartburn  Endocrine: Negative for polydipsia and polyuria.  Genitourinary:  Negative for dysuria and hematuria.  Musculoskeletal:  Positive for back pain. Negative for arthralgias and joint swelling.       Some back spasm--uses heat  Skin:  Negative for rash.       No suspicious skin lesions   Allergic/Immunologic: Negative for environmental allergies and immunocompromised state.  Neurological:  Negative for dizziness, syncope and light-headedness.       Headaches are better--hasn't needed sumatriptan  Hematological:  Negative for adenopathy. Does not bruise/bleed easily.  Psychiatric/Behavioral:  Positive for sleep disturbance. Negative for dysphoric mood. The patient is not nervous/anxious.        Objective:   Physical Exam Constitutional:      Appearance: Normal appearance.  HENT:     Mouth/Throat:     Pharynx: No oropharyngeal exudate or posterior oropharyngeal erythema.  Eyes:     Conjunctiva/sclera: Conjunctivae normal.     Pupils: Pupils are equal, round, and reactive to light.  Cardiovascular:     Rate and Rhythm: Normal rate and regular rhythm.     Pulses: Normal pulses.     Heart sounds: No murmur heard.    No gallop.  Pulmonary:     Effort: Pulmonary effort is normal.     Breath sounds: Normal breath sounds. No wheezing or rales.  Abdominal:     Palpations: Abdomen is soft.     Tenderness: There is no abdominal tenderness.  Genitourinary:    Comments: Vaginal stenosis---limited exam Pap done Musculoskeletal:     Cervical back: Neck supple.     Right lower leg: No edema.     Left lower leg: No edema.  Lymphadenopathy:     Cervical: No cervical adenopathy.  Skin:    Findings: No rash.  Neurological:     General: No focal deficit present.     Mental Status: She is alert and oriented to person, place, and time.  Psychiatric:        Mood and Affect: Mood normal.        Behavior: Behavior normal.            Assessment & Plan:

## 2023-05-25 LAB — CBC
HCT: 42.4 % (ref 36.0–46.0)
Hemoglobin: 13.9 g/dL (ref 12.0–15.0)
MCHC: 32.9 g/dL (ref 30.0–36.0)
MCV: 94.3 fl (ref 78.0–100.0)
Platelets: 327 10*3/uL (ref 150.0–400.0)
RBC: 4.49 Mil/uL (ref 3.87–5.11)
RDW: 12.3 % (ref 11.5–15.5)
WBC: 8.5 10*3/uL (ref 4.0–10.5)

## 2023-05-25 LAB — LIPID PANEL
Cholesterol: 224 mg/dL — ABNORMAL HIGH (ref 0–200)
HDL: 72.2 mg/dL (ref >39.00)
LDL Cholesterol: 134 mg/dL — ABNORMAL HIGH (ref 0–99)
NonHDL: 151.33
Total CHOL/HDL Ratio: 3
Triglycerides: 87 mg/dL (ref 0.0–149.0)
VLDL: 17.4 mg/dL (ref 0.0–40.0)

## 2023-05-25 LAB — COMPREHENSIVE METABOLIC PANEL
ALT: 15 U/L (ref 0–35)
AST: 25 U/L (ref 0–37)
Albumin: 4.5 g/dL (ref 3.5–5.2)
Alkaline Phosphatase: 83 U/L (ref 39–117)
BUN: 14 mg/dL (ref 6–23)
CO2: 30 meq/L (ref 19–32)
Calcium: 10 mg/dL (ref 8.4–10.5)
Chloride: 98 meq/L (ref 96–112)
Creatinine, Ser: 0.99 mg/dL (ref 0.40–1.20)
GFR: 65.21 mL/min (ref >60.00)
Glucose, Bld: 90 mg/dL (ref 70–99)
Potassium: 4.4 meq/L (ref 3.5–5.1)
Sodium: 136 meq/L (ref 135–145)
Total Bilirubin: 0.8 mg/dL (ref 0.2–1.2)
Total Protein: 7.3 g/dL (ref 6.0–8.3)

## 2023-05-25 LAB — TSH: TSH: 2.5 u[IU]/mL (ref 0.35–5.50)

## 2023-05-25 NOTE — Addendum Note (Signed)
Addended by: Alvina Chou on: 05/25/2023 07:27 AM   Modules accepted: Orders

## 2023-05-29 LAB — CYTOLOGY - PAP
Comment: NEGATIVE
Diagnosis: NEGATIVE
High risk HPV: NEGATIVE

## 2023-08-12 IMAGING — CT CT ANGIO CHEST
2 of 7 series · 18 of 46 positions shown · IV contrast (APPLIED)
Comparison: 03/08/2018

CLINICAL DATA: PE suspected, positive D-dimer, sharp chest pain
radiating to left arm with slight shortness of breath

EXAM:
CT ANGIOGRAPHY CHEST WITH CONTRAST
TECHNIQUE: Multidetector CT imaging of the chest was performed using the
standard protocol during bolus administration of intravenous
contrast. Multiplanar CT image reconstructions and MIPs were
obtained to evaluate the vascular anatomy.

[Series 5: thins · axial · 0.75mm/px · z∈[-276,+20]mm · 15 of 411 slices shown]
[im 21/411  lung]
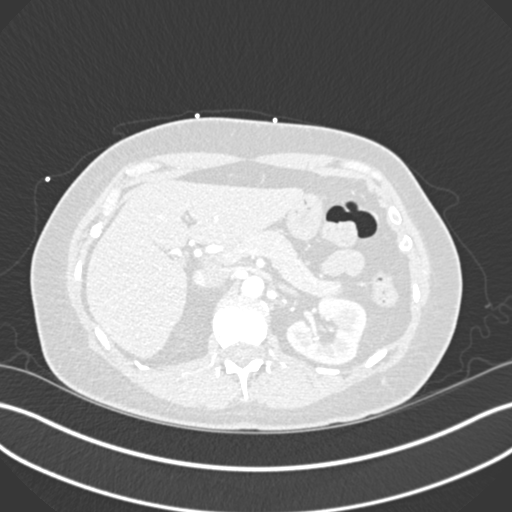
[im 42/411  soft-tissue]
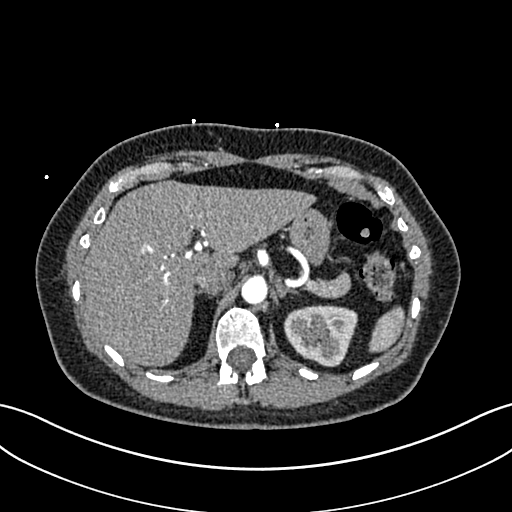
[im 83/411  lung]
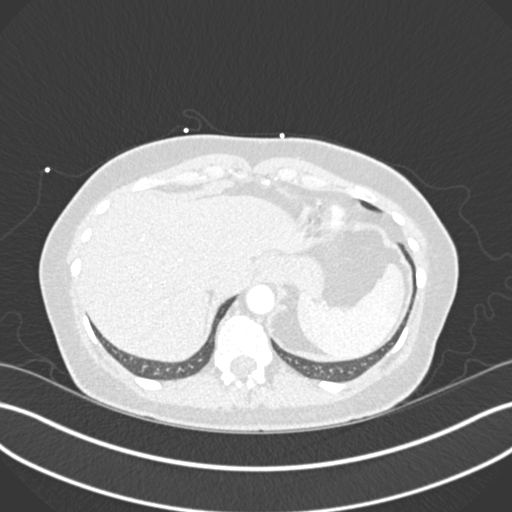
[im 103/411  soft-tissue]
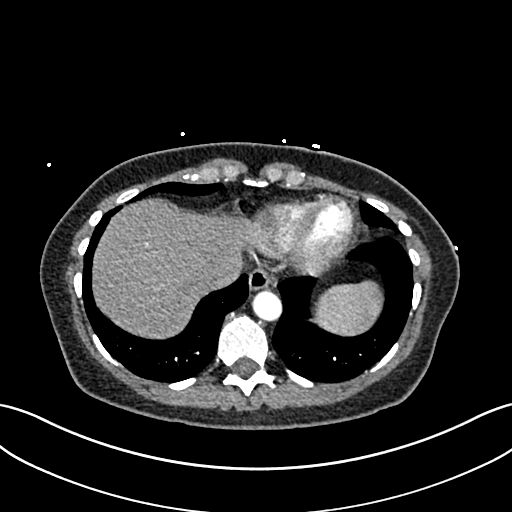
[im 124/411  lung]
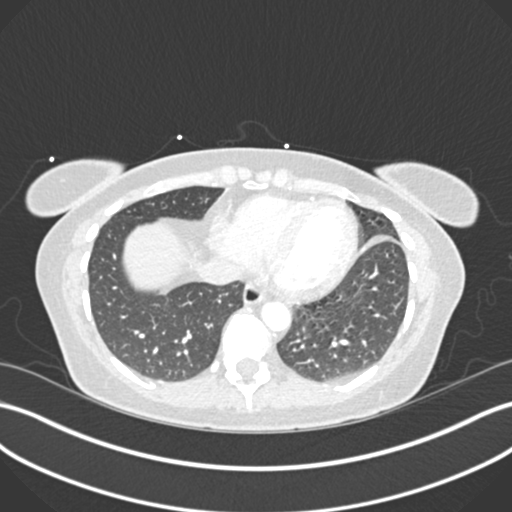
[im 144/411  soft-tissue]
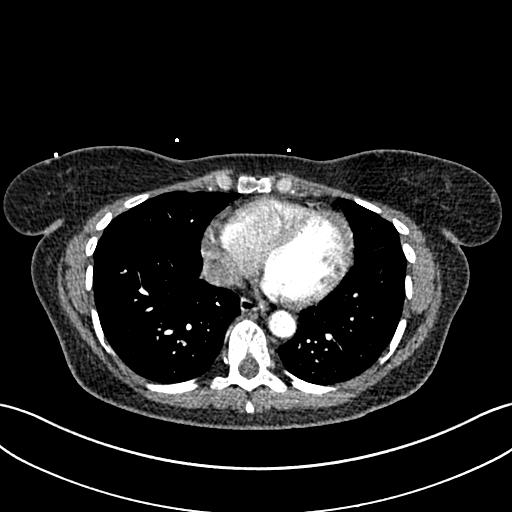
[im 185/411  lung]
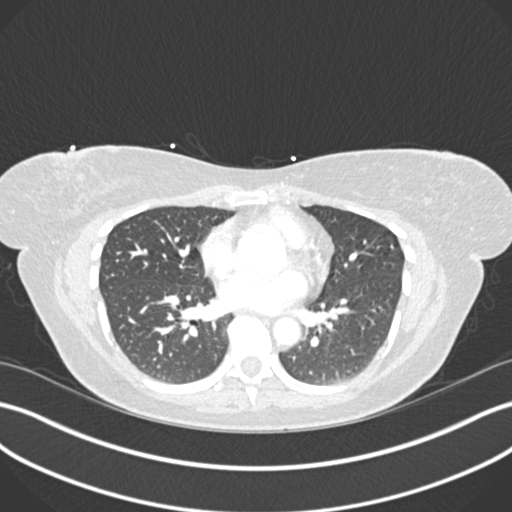
[im 206/411  soft-tissue]
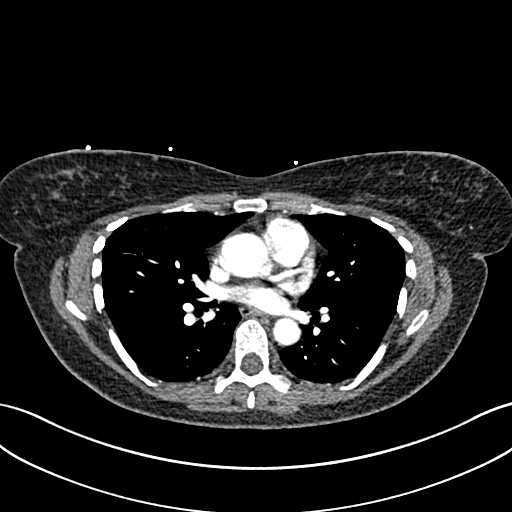
[im 226/411  lung]
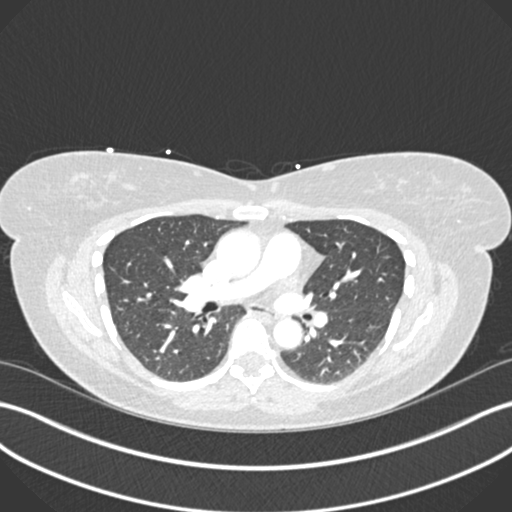
[im 267/411  soft-tissue]
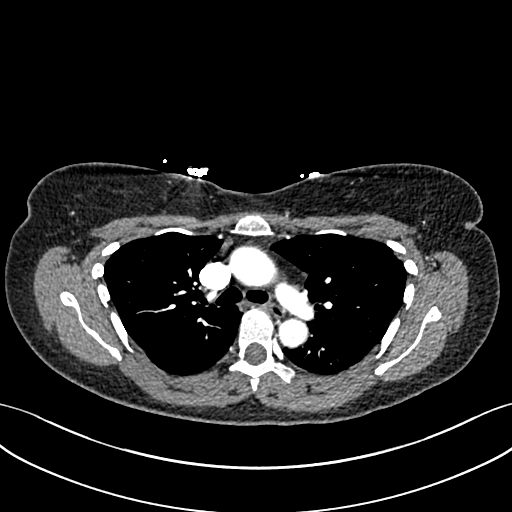
[im 288/411  lung]
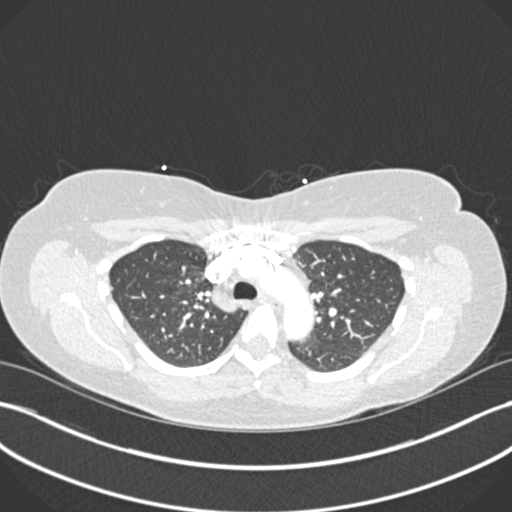
[im 308/411  soft-tissue]
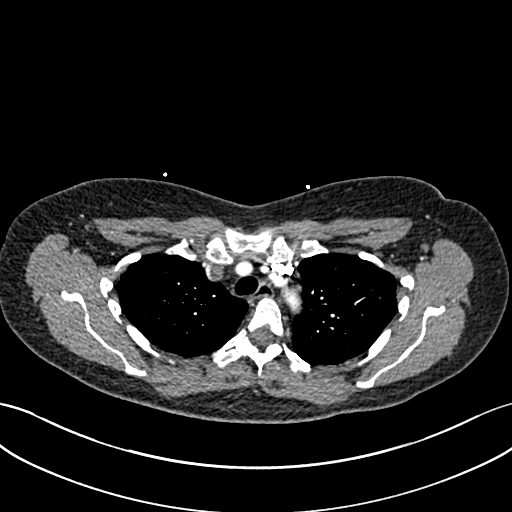
[im 329/411  lung]
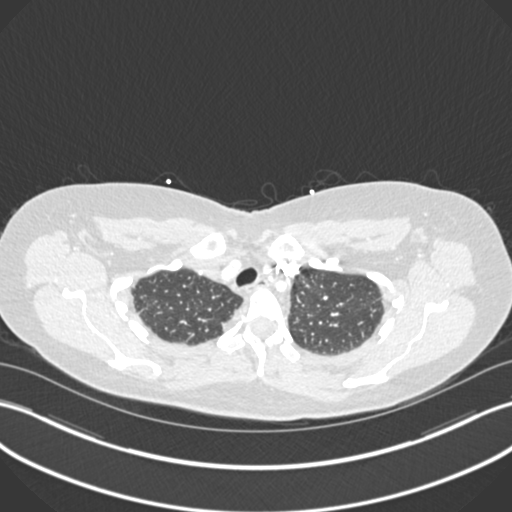
[im 370/411  soft-tissue]
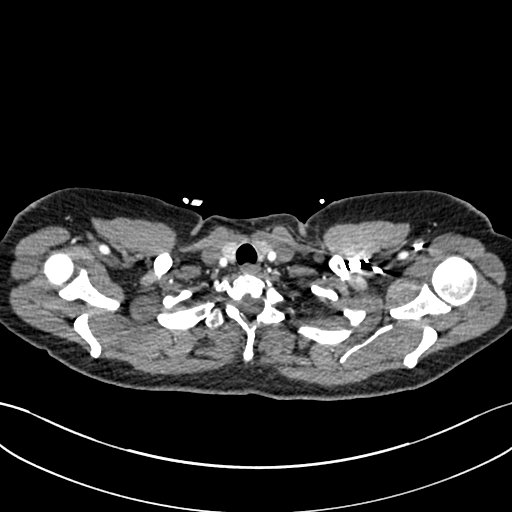
[im 390/411  lung]
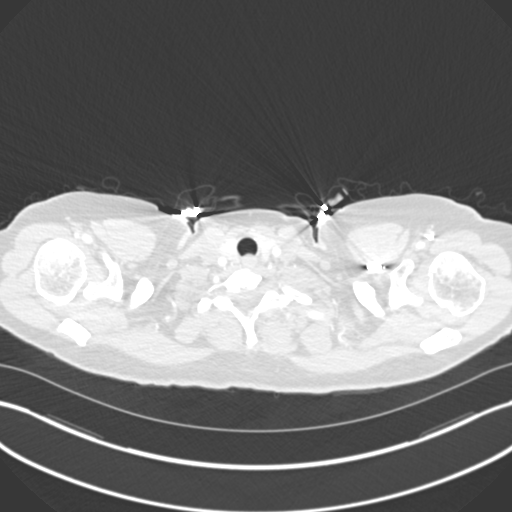

[Series 7: coronal mpr · coronal · 0.64mm/px · 3 of 79 slices shown]
[im 20/79  soft-tissue]
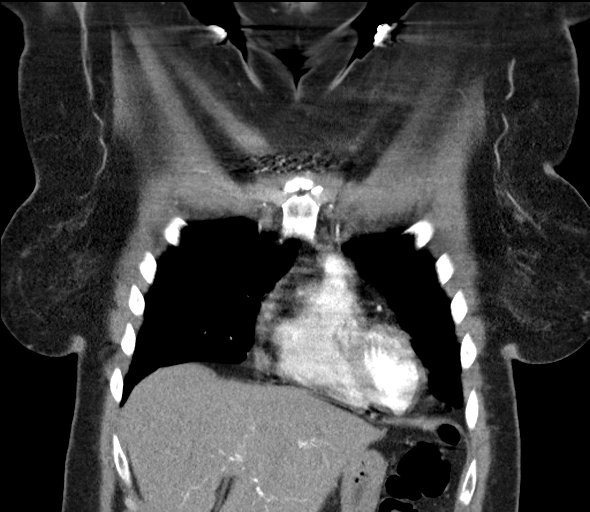
[im 40/79  soft-tissue]
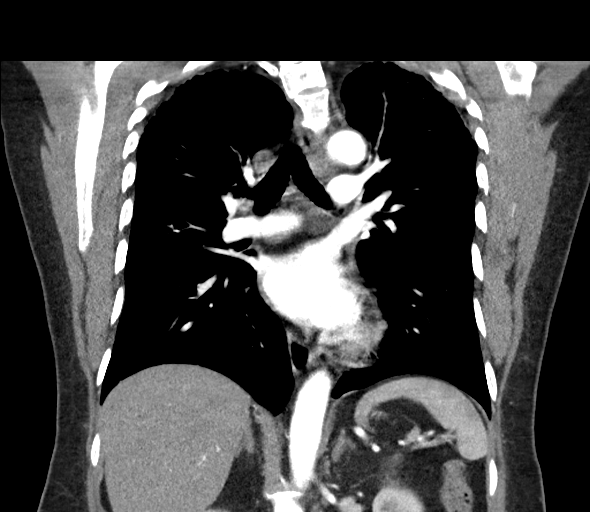
[im 59/79  soft-tissue]
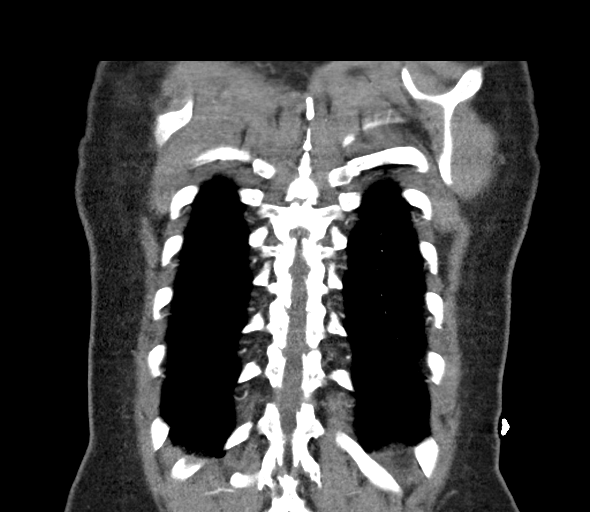

[18 of 46 positions shown; findings below may reference images not displayed]

RADIATION DOSE REDUCTION: This exam was performed according to the
departmental dose-optimization program which includes automated
exposure control, adjustment of the mA and/or kV according to
patient size and/or use of iterative reconstruction technique.

CONTRAST:  75mL OMNIPAQUE IOHEXOL 350 MG/ML SOLN
FINDINGS: Cardiovascular: Satisfactory opacification of the pulmonary arteries
to the segmental level. No evidence of pulmonary embolism. Normal
heart size. No pericardial effusion.

Mediastinum/Nodes: No enlarged mediastinal, hilar, or axillary lymph
nodes. Thyroid gland, trachea, and esophagus demonstrate no
significant findings.

Lungs/Pleura: Lungs are clear. No pleural effusion or pneumothorax.

Upper Abdomen: No acute abnormality.

Musculoskeletal: S shaped curvature of the thoracolumbar spine. No
acute osseous abnormality.

Review of the MIP images confirms the above findings.
IMPRESSION: Negative for pulmonary embolism.  No acute process in the chest.

## 2023-11-29 ENCOUNTER — Ambulatory Visit: Payer: 59 | Admitting: Internal Medicine

## 2023-11-29 ENCOUNTER — Encounter: Payer: Self-pay | Admitting: Internal Medicine

## 2023-11-29 VITALS — BP 118/84 | HR 113 | Temp 98.8°F | Ht 66.0 in | Wt 187.0 lb

## 2023-11-29 DIAGNOSIS — J0140 Acute pansinusitis, unspecified: Secondary | ICD-10-CM | POA: Insufficient documentation

## 2023-11-29 MED ORDER — AMOXICILLIN-POT CLAVULANATE 875-125 MG PO TABS: 1.0000 | ORAL_TABLET | Freq: Two times a day (BID) | ORAL | 1 refills | Status: DC

## 2023-11-29 NOTE — Progress Notes (Signed)
Subjective:    Patient ID: Erin Fuentes, female    DOB: [birth year], 53 y.o.   MRN: [id number]  HPI Here due to persistent respiratory symptoms  Sick for a week Started with vomiting--then thought she was improving Mostly feels in her sinuses---pain, yellow drainage with some blood Some fever last night--tylenol helps Cough ---slightly productive Does feel the post nasal drip Some sore throat Some ear pain--and frontal No SOB Some chills and very achy  Current Outpatient Medications on File Prior to Visit  Medication Sig Dispense Refill   cyanocobalamin 1000 MCG tablet Take 1,000 mcg by mouth every other day.     cyclobenzaprine (FLEXERIL) 10 MG tablet Take 1 tablet (10 mg total) by mouth at bedtime as needed for muscle spasms. 15 tablet 0   estradiol (ESTRACE) 0.5 MG tablet Take 1 tablet (0.5 mg total) by mouth daily. 90 tablet 3   hydrocortisone 2.5 % cream Apply topically 3 (three) times daily as needed. 28 g 3   Multiple Vitamin (MULTIVITAMIN) tablet Take 1 tablet by mouth daily.     progesterone (PROMETRIUM) 100 MG capsule Take 1 capsule (100 mg total) by mouth daily. 90 capsule 3   SUMAtriptan (IMITREX) 50 MG tablet Take 1 tablet (50 mg total) by mouth as needed for migraine. May repeat in 2 hours if headache persists or recurs.TAKE 1 TABLET BY MOUTH ONCE FOR 1 DOSE. MAY REPEAT IN 2 HOURS IF HEADACHE PERSISTS OR RECURS. 9 tablet 11   triamcinolone cream (KENALOG) 0.1 % Apply 1 Application topically 2 (two) times daily as needed. 45 g 1   No current facility-administered medications on file prior to visit.    Allergies  Allergen Reactions   Erythromycin Base Diarrhea   Morphine And Codeine Nausea And Vomiting    And excessive sweating     Past Medical History:  Diagnosis Date   Allergy    with seasonal changes    Hemorrhoids    MVP (mitral valve prolapse)    Scoliosis    mild per pt    Vitamin B12 deficiency    oral therapy effective    Past Surgical  History:  Procedure Laterality Date   APPENDECTOMY  2011   wrapped around ovary , saved Ovary-    CESAREAN SECTION  1993, 1996   x 2   FEMORAL HERNIA REPAIR  ~1978   left ?    Family History  Problem Relation Age of Onset   Colon polyps Mother    Ovarian cancer Mother        caught early   Hyperthyroidism Maternal Uncle    Coronary artery disease Maternal Uncle        LAD stent   Diabetes Paternal Uncle    Heart disease Maternal Grandmother        died of sudden MI   Other Maternal Grandfather        4 tumors behind intestine but were benign, no cancer- used radiation to shrink intestines    Hyperthyroidism Nephew    Diabetes Other        very prevalent   Cancer Neg Hx    Colon cancer Neg Hx    Esophageal cancer Neg Hx    Stomach cancer Neg Hx    Rectal cancer Neg Hx     Social History   Socioeconomic History   Marital status: Divorced    Spouse name: Not on file   Number of children: 2   Years of education: Not on  file   Highest education level: Not on file  Occupational History   Occupation: Social research officer, 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[place]  Tobacco Use   Smoking status: Never   Smokeless tobacco: Never  Vaping Use   Vaping status: Never Used  Substance and Sexual Activity   Alcohol use: No   Drug use: No   Sexual activity: Not on file  Other Topics Concern   Not on file  Social History Narrative   Not on file   Social Drivers of Health   Financial Resource Strain: Not on file  Food Insecurity: Not on file  Transportation Needs: Not on file  Physical Activity: Not on file  Stress: Not on file  Social Connections: Not on file  Intimate Partner Violence: Not on file   Review of Systems Bad taste in mouth--but hasn't lost taste or smell Some low back pain Appetite is off    Objective:   Physical Exam Constitutional:      Appearance: Normal appearance.  HENT:     Head:     Comments: Frontal and maxillary tenderness    Right Ear: Tympanic  membrane and ear canal normal.     Left Ear: Tympanic membrane and ear canal normal.     Mouth/Throat:     Pharynx: No oropharyngeal exudate or posterior oropharyngeal erythema.  Pulmonary:     Effort: Pulmonary effort is normal.     Breath sounds: Normal breath sounds. No wheezing or rales.  Musculoskeletal:     Cervical back: Neck supple.  Lymphadenopathy:     Cervical: No cervical adenopathy.  Neurological:     Mental Status: She is alert.            Assessment & Plan:

## 2023-11-29 NOTE — Assessment & Plan Note (Signed)
May have had preceding viral infection with vomiting, myalgia, chills--and now settled in sinuses Discussed analgesics Augmentin 875 bid x 7 days (with refill) Recheck if worsening

## 2023-12-10 ENCOUNTER — Telehealth: Payer: Self-pay

## 2023-12-10 NOTE — Telephone Encounter (Unsigned)
 Copied from CRM [id number]. Topic: General - Other >> Dec 10, 2023 12:00 PM Fredrich Romans wrote: Reason for CRM: Patient would like to know if she could have a doctors noted from when she was seen on 11/29/2023 for jury duty.She missed jury duty when she was sick and needs documentation to prove. Patient would like a call to pick up once ready.

## 2023-12-11 NOTE — Telephone Encounter (Signed)
 Spoke to pt. 12-06-23 Jurer # [id number], File [id number]. She will get it from MyChart once it is ready.

## 2023-12-11 NOTE — Telephone Encounter (Signed)
Note created and sent to MyChart.

## 2024-02-20 ENCOUNTER — Encounter: Payer: Self-pay | Admitting: Internal Medicine

## 2024-02-20 ENCOUNTER — Ambulatory Visit (INDEPENDENT_AMBULATORY_CARE_PROVIDER_SITE_OTHER): Admitting: Internal Medicine

## 2024-02-20 VITALS — BP 104/74 | HR 94 | Temp 97.7°F | Ht 66.0 in | Wt 179.0 lb

## 2024-02-20 DIAGNOSIS — L304 Erythema intertrigo: Secondary | ICD-10-CM | POA: Diagnosis not present

## 2024-02-20 MED ORDER — KETOCONAZOLE 2 % EX CREA: 1.0000 | TOPICAL_CREAM | Freq: Every day | CUTANEOUS | 3 refills | Status: AC

## 2024-02-20 MED ORDER — CYCLOBENZAPRINE HCL 10 MG PO TABS: 10.0000 mg | ORAL_TABLET | Freq: Every evening | ORAL | 0 refills | Status: DC | PRN

## 2024-02-20 MED ORDER — FLUCONAZOLE 150 MG PO TABS: 150.0000 mg | ORAL_TABLET | ORAL | 3 refills | Status: DC

## 2024-02-20 NOTE — Progress Notes (Signed)
 Subjective:    Patient ID: Erin Fuentes, female    DOB: 03-20-1971, 53 y.o.   MRN: [id number]  HPI Here due to rash under breasts Left is worse  Redness under both breasts--most in the summer when hot Has tried aloe and paper towels to keep from rubbing Gets aggravated and hurts  No sig issue in winter  Current Outpatient Medications on File Prior to Visit  Medication Sig Dispense Refill   cyanocobalamin 1000 MCG tablet Take 1,000 mcg by mouth every other day.     cyclobenzaprine  (FLEXERIL ) 10 MG tablet Take 1 tablet (10 mg total) by mouth at bedtime as needed for muscle spasms. 15 tablet 0   estradiol  (ESTRACE ) 0.5 MG tablet Take 1 tablet (0.5 mg total) by mouth daily. 90 tablet 3   hydrocortisone  2.5 % cream Apply topically 3 (three) times daily as needed. 28 g 3   Multiple Vitamin (MULTIVITAMIN) tablet Take 1 tablet by mouth daily.     progesterone  (PROMETRIUM ) 100 MG capsule Take 1 capsule (100 mg total) by mouth daily. 90 capsule 3   SUMAtriptan  (IMITREX ) 50 MG tablet Take 1 tablet (50 mg total) by mouth as needed for migraine. May repeat in 2 hours if headache persists or recurs.TAKE 1 TABLET BY MOUTH ONCE FOR 1 DOSE. MAY REPEAT IN 2 HOURS IF HEADACHE PERSISTS OR RECURS. 9 tablet 11   triamcinolone  cream (KENALOG ) 0.1 % Apply 1 Application topically 2 (two) times daily as needed. 45 g 1   No current facility-administered medications on file prior to visit.    Allergies  Allergen Reactions   Erythromycin Base Diarrhea   Morphine And Codeine Nausea And Vomiting    And excessive sweating     Past Medical History:  Diagnosis Date   Allergy    with seasonal changes    Hemorrhoids    MVP (mitral valve prolapse)    Scoliosis    mild per pt    Vitamin B12 deficiency    oral therapy effective    Past Surgical History:  Procedure Laterality Date   APPENDECTOMY  2011   wrapped around ovary , saved Ovary-    CESAREAN SECTION  1993, 1996   x 2   FEMORAL HERNIA  REPAIR  ~1978   left ?    Family History  Problem Relation Age of Onset   Colon polyps Mother    Ovarian cancer Mother        caught early   Hyperthyroidism Maternal Uncle    Coronary artery disease Maternal Uncle        LAD stent   Diabetes Paternal Uncle    Heart disease Maternal Grandmother        died of sudden MI   Other Maternal Grandfather        4 tumors behind intestine but were benign, no cancer- used radiation to shrink intestines    Hyperthyroidism Nephew    Diabetes Other        very prevalent   Cancer Neg Hx    Colon cancer Neg Hx    Esophageal cancer Neg Hx    Stomach cancer Neg Hx    Rectal cancer Neg Hx     Social History   Socioeconomic History   Marital status: Divorced    Spouse name: Not on file   Number of children: 2   Years of education: Not on file   Highest education level: Not on file  Occupational History   Occupation: Social research officer, 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[place]  Tobacco Use   Smoking status: Never   Smokeless tobacco: Never  Vaping Use   Vaping status: Never Used  Substance and Sexual Activity   Alcohol use: No   Drug use: No   Sexual activity: Not on file  Other Topics Concern   Not on file  Social History Narrative   Not on file   Social Drivers of Health   Financial Resource Strain: Not on file  Food Insecurity: Not on file  Transportation Needs: Not on file  Physical Activity: Not on file  Stress: Not on file  Social Connections: Not on file  Intimate Partner Violence: Not on file   Review of Systems     Objective:   Physical Exam Constitutional:      Appearance: Normal appearance.  Skin:    Comments: Classic yeast rash under breasts---left > right  Neurological:     Mental Status: She is alert.            Assessment & Plan:

## 2024-02-20 NOTE — Assessment & Plan Note (Signed)
 Will give ketoconazole cream for regular use at night Fluconazole 150mg  weekly to help clear it up quick Powder during day

## 2024-02-28 ENCOUNTER — Ambulatory Visit (INDEPENDENT_AMBULATORY_CARE_PROVIDER_SITE_OTHER): Admitting: Internal Medicine

## 2024-02-28 ENCOUNTER — Encounter: Payer: Self-pay | Admitting: Internal Medicine

## 2024-02-28 VITALS — BP 110/80 | HR 95 | Temp 98.1°F | Ht 66.0 in | Wt 183.0 lb

## 2024-02-28 DIAGNOSIS — H6011 Cellulitis of right external ear: Secondary | ICD-10-CM | POA: Insufficient documentation

## 2024-02-28 MED ORDER — CEPHALEXIN 500 MG PO CAPS: 500.0000 mg | ORAL_CAPSULE | Freq: Three times a day (TID) | ORAL | 1 refills | Status: DC

## 2024-02-28 MED ORDER — NEOMYCIN-POLYMYXIN-HC 3.5-10000-1 OT SUSP: 4.0000 [drp] | Freq: Four times a day (QID) | OTIC | 1 refills | Status: DC

## 2024-02-28 NOTE — Assessment & Plan Note (Addendum)
 Likely a insect bite Some inflammation in canal as well Will treat with cephalexin  500 tid x 5 days Cortisporin for the canal Can try warm compress also

## 2024-02-28 NOTE — Progress Notes (Signed)
 Subjective:    Patient ID: Erin Fuentes, female    DOB: 22-Nov-1970, 53 y.o.   MRN: [id number]  HPI Here for some right ear pain  Yesterday--noticed some apin around the outside and behind the right ear Feels some swelling Got bad last night Feels warm and some tenderness Got worse last night  Current Outpatient Medications on File Prior to Visit  Medication Sig Dispense Refill   cyanocobalamin 1000 MCG tablet Take 1,000 mcg by mouth every other day.     cyclobenzaprine  (FLEXERIL ) 10 MG tablet Take 1 tablet (10 mg total) by mouth at bedtime as needed for muscle spasms. 15 tablet 0   fluconazole  (DIFLUCAN ) 150 MG tablet Take 1 tablet (150 mg total) by mouth once a week. 4 tablet 3   hydrocortisone  2.5 % cream Apply topically 3 (three) times daily as needed. 28 g 3   ketoconazole  (NIZORAL ) 2 % cream Apply 1 Application topically daily. At bedtime 60 g 3   Multiple Vitamin (MULTIVITAMIN) tablet Take 1 tablet by mouth daily.     SUMAtriptan  (IMITREX ) 50 MG tablet Take 1 tablet (50 mg total) by mouth as needed for migraine. May repeat in 2 hours if headache persists or recurs.TAKE 1 TABLET BY MOUTH ONCE FOR 1 DOSE. MAY REPEAT IN 2 HOURS IF HEADACHE PERSISTS OR RECURS. 9 tablet 11   triamcinolone  cream (KENALOG ) 0.1 % Apply 1 Application topically 2 (two) times daily as needed. 45 g 1   estradiol  (ESTRACE ) 0.5 MG tablet Take 1 tablet (0.5 mg total) by mouth daily. (Patient not taking: Reported on 02/28/2024) 90 tablet 3   progesterone  (PROMETRIUM ) 100 MG capsule Take 1 capsule (100 mg total) by mouth daily. (Patient not taking: Reported on 02/28/2024) 90 capsule 3   No current facility-administered medications on file prior to visit.    Allergies  Allergen Reactions   Erythromycin Base Diarrhea   Morphine And Codeine Nausea And Vomiting    And excessive sweating     Past Medical History:  Diagnosis Date   Allergy    with seasonal changes    Hemorrhoids    MVP (mitral valve  prolapse)    Scoliosis    mild per pt    Vitamin B12 deficiency    oral therapy effective    Past Surgical History:  Procedure Laterality Date   APPENDECTOMY  2011   wrapped around ovary , saved Ovary-    CESAREAN SECTION  1993, 1996   x 2   FEMORAL HERNIA REPAIR  ~1978   left ?    Family History  Problem Relation Age of Onset   Colon polyps Mother    Ovarian cancer Mother        caught early   Hyperthyroidism Maternal Uncle    Coronary artery disease Maternal Uncle        LAD stent   Diabetes Paternal Uncle    Heart disease Maternal Grandmother        died of sudden MI   Other Maternal Grandfather        4 tumors behind intestine but were benign, no cancer- used radiation to shrink intestines    Hyperthyroidism Nephew    Diabetes Other        very prevalent   Cancer Neg Hx    Colon cancer Neg Hx    Esophageal cancer Neg Hx    Stomach cancer Neg Hx    Rectal cancer Neg Hx     Social History  Socioeconomic History   Marital status: Divorced    Spouse name: Not on file   Number of children: 2   Years of education: Not on file   Highest education level: Not on file  Occupational History   Occupation: Social research officer, 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[place]  Tobacco Use   Smoking status: Never   Smokeless tobacco: Never  Vaping Use   Vaping status: Never Used  Substance and Sexual Activity   Alcohol use: No   Drug use: No   Sexual activity: Not on file  Other Topics Concern   Not on file  Social History Narrative   Not on file   Social Drivers of Health   Financial Resource Strain: Not on file  Food Insecurity: Not on file  Transportation Needs: Not on file  Physical Activity: Not on file  Stress: Not on file  Social Connections: Not on file  Intimate Partner Violence: Not on file   Review of Systems No fever Hearing is not as good on right since this started Some sinus congestion---but doesn't feel like she has a cold    Objective:   Physical  Exam Constitutional:      Appearance: Normal appearance.  HENT:     Left Ear: Tympanic membrane and ear canal normal.     Ears:     Comments: Right pinna is slightly red, warm and tender Canal is slightly red but no discharge TM normal No obvious bite Musculoskeletal:     Cervical back: Neck supple.  Lymphadenopathy:     Cervical: No cervical adenopathy.  Neurological:     Mental Status: She is alert.            Assessment & Plan:

## 2024-02-29 ENCOUNTER — Encounter: Payer: Self-pay | Admitting: Internal Medicine

## 2024-02-29 MED ORDER — NEOMYCIN-POLYMYXIN-HC 3.5-10000-1 OT SUSP: 4.0000 [drp] | Freq: Four times a day (QID) | OTIC | 1 refills | Status: AC

## 2024-05-08 ENCOUNTER — Other Ambulatory Visit: Payer: Self-pay | Admitting: Internal Medicine

## 2024-05-08 DIAGNOSIS — Z1231 Encounter for screening mammogram for malignant neoplasm of breast: Secondary | ICD-10-CM

## 2024-05-09 ENCOUNTER — Encounter

## 2024-05-29 ENCOUNTER — Encounter: Payer: 59 | Admitting: Internal Medicine

## 2024-06-03 ENCOUNTER — Ambulatory Visit (INDEPENDENT_AMBULATORY_CARE_PROVIDER_SITE_OTHER): Admitting: Internal Medicine

## 2024-06-03 ENCOUNTER — Encounter: Payer: Self-pay | Admitting: Internal Medicine

## 2024-06-03 VITALS — BP 118/88 | HR 80 | Temp 98.2°F | Ht 65.0 in | Wt 184.0 lb

## 2024-06-03 DIAGNOSIS — I872 Venous insufficiency (chronic) (peripheral): Secondary | ICD-10-CM | POA: Diagnosis not present

## 2024-06-03 DIAGNOSIS — Z0000 Encounter for general adult medical examination without abnormal findings: Secondary | ICD-10-CM

## 2024-06-03 DIAGNOSIS — Z23 Encounter for immunization: Secondary | ICD-10-CM

## 2024-06-03 DIAGNOSIS — I471 Supraventricular tachycardia, unspecified: Secondary | ICD-10-CM

## 2024-06-03 DIAGNOSIS — E538 Deficiency of other specified B group vitamins: Secondary | ICD-10-CM | POA: Diagnosis not present

## 2024-06-03 NOTE — Addendum Note (Signed)
 Addended by: SEBASTIAN DANNA GRADE on: 06/03/2024 01:33 PM   Modules accepted: Orders

## 2024-06-03 NOTE — Assessment & Plan Note (Signed)
 Not bad Discussed support hose

## 2024-06-03 NOTE — Progress Notes (Signed)
 Subjective:    Patient ID: Erin Fuentes, female    DOB: 1971/08/21, 53 y.o.   MRN: [id number]  HPI Here for physical  Having trouble with her left ear Inside and around the back Tried drops and no help Hears a wooshing sound as well (low)  Doing well otherwise  Migraines intermittent The sumatriptan  works  Some trouble sleeping---only 4-5 hours at a time Usually can get back to sleep Keeps to regular schedule (10PM - 7AM) No persistent daytime somnolence  Current Outpatient Medications on File Prior to Visit  Medication Sig Dispense Refill   cyanocobalamin  1000 MCG tablet Take 1,000 mcg by mouth every other day.     hydrocortisone  2.5 % cream Apply topically 3 (three) times daily as needed. 28 g 3   ketoconazole  (NIZORAL ) 2 % cream Apply 1 Application topically daily. At bedtime 60 g 3   Multiple Vitamin (MULTIVITAMIN) tablet Take 1 tablet by mouth daily.     neomycin -polymyxin-hydrocortisone  (CORTISPORIN) 3.5-10000-1 OTIC suspension Place 4 drops into the right ear 4 (four) times daily. 10 mL 1   SUMAtriptan  (IMITREX ) 50 MG tablet Take 1 tablet (50 mg total) by mouth as needed for migraine. May repeat in 2 hours if headache persists or recurs.TAKE 1 TABLET BY MOUTH ONCE FOR 1 DOSE. MAY REPEAT IN 2 HOURS IF HEADACHE PERSISTS OR RECURS. 9 tablet 11   triamcinolone  cream (KENALOG ) 0.1 % Apply 1 Application topically 2 (two) times daily as needed. 45 g 1   cyclobenzaprine  (FLEXERIL ) 10 MG tablet Take 1 tablet (10 mg total) by mouth at bedtime as needed for muscle spasms. 15 tablet 0   No current facility-administered medications on file prior to visit.    Allergies  Allergen Reactions   Erythromycin Base Diarrhea   Morphine And Codeine Nausea And Vomiting    And excessive sweating     Past Medical History:  Diagnosis Date   Allergy    with seasonal changes    Hemorrhoids    MVP (mitral valve prolapse)    Scoliosis    mild per pt    Vitamin B12 deficiency     oral therapy effective    Past Surgical History:  Procedure Laterality Date   APPENDECTOMY  2011   wrapped around ovary , saved Ovary-    CESAREAN SECTION  1993, 1996   x 2   FEMORAL HERNIA REPAIR  ~1978   left ?    Family History  Problem Relation Age of Onset   Colon polyps Mother    Ovarian cancer Mother        caught early   Hyperthyroidism Maternal Uncle    Coronary artery disease Maternal Uncle        LAD stent   Diabetes Paternal Uncle    Heart disease Maternal Grandmother        died of sudden MI   Other Maternal Grandfather        4 tumors behind intestine but were benign, no cancer- used radiation to shrink intestines    Hyperthyroidism Nephew    Diabetes Other        very prevalent   Cancer Neg Hx    Colon cancer Neg Hx    Esophageal cancer Neg Hx    Stomach cancer Neg Hx    Rectal cancer Neg Hx     Social History   Socioeconomic History   Marital status: Divorced    Spouse name: Not on file   Number of children: 2  Years of education: Not on file   Highest education level: Not on file  Occupational History   Occupation: Social research officer, 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[place]  Tobacco Use   Smoking status: Never   Smokeless tobacco: Never  Vaping Use   Vaping status: Never Used  Substance and Sexual Activity   Alcohol use: No   Drug use: No   Sexual activity: Not on file  Other Topics Concern   Not on file  Social History Narrative   Not on file   Social Drivers of Health   Financial Resource Strain: Not on file  Food Insecurity: Not on file  Transportation Needs: Not on file  Physical Activity: Not on file  Stress: Not on file  Social Connections: Not on file  Intimate Partner Violence: Not on file   Review of Systems  Constitutional:  Negative for fatigue and unexpected weight change.       Walks regularly Wears seat belt  HENT:  Negative for dental problem and hearing loss.        Due for dentist  Eyes:  Negative for visual  disturbance.       No diplopia or unilateral vision loss  Respiratory:  Negative for cough, chest tightness and shortness of breath.   Cardiovascular:  Negative for chest pain and leg swelling.       Will note fast heart at times---with stress  Gastrointestinal:  Negative for blood in stool and constipation.       No heartburn  Endocrine: Negative for polydipsia and polyuria.  Genitourinary:  Negative for dysuria and hematuria.       No sex--no problem  Musculoskeletal:  Negative for arthralgias and joint swelling.       Back pain if overdoes it  Skin:  Negative for rash.       Mild varicosities  Allergic/Immunologic: Positive for environmental allergies. Negative for immunocompromised state.       Uses zyrtec in the fall  Neurological:  Negative for dizziness, syncope and light-headedness.  Hematological:  Negative for adenopathy. Does not bruise/bleed easily.  Psychiatric/Behavioral:  Positive for sleep disturbance. Negative for dysphoric mood. The patient is not nervous/anxious.        Objective:   Physical Exam Constitutional:      Appearance: Normal appearance.  HENT:     Right Ear: Tympanic membrane and ear canal normal.     Ears:     Comments: Left TM might be slightly retracted--but no inflammation and normal otherwise Discussed ENT if ongoing symptoms    Mouth/Throat:     Pharynx: No oropharyngeal exudate or posterior oropharyngeal erythema.  Eyes:     Conjunctiva/sclera: Conjunctivae normal.     Pupils: Pupils are equal, round, and reactive to light.  Cardiovascular:     Rate and Rhythm: Normal rate and regular rhythm.     Pulses: Normal pulses.     Heart sounds: No murmur heard.    No gallop.  Pulmonary:     Effort: Pulmonary effort is normal.     Breath sounds: Normal breath sounds. No wheezing or rales.  Abdominal:     Palpations: Abdomen is soft.     Tenderness: There is no abdominal tenderness.  Musculoskeletal:     Cervical back: Neck supple.     Right  lower leg: No edema.     Left lower leg: No edema.  Lymphadenopathy:     Cervical: No cervical adenopathy.  Skin:    Findings: No rash.  Neurological:  General: No focal deficit present.     Mental Status: She is alert and oriented to person, place, and time.  Psychiatric:        Mood and Affect: Mood normal.        Behavior: Behavior normal.            Assessment & Plan:

## 2024-06-03 NOTE — Assessment & Plan Note (Signed)
 No apparent symptomatic recurrence No Rx

## 2024-06-03 NOTE — Assessment & Plan Note (Signed)
 Discussed exercise Not excited about vaccines---but will go ahead with the shingrix  Colon due next year Has rescheduled mammogram Pap in 4 years

## 2024-06-04 ENCOUNTER — Other Ambulatory Visit: Payer: Self-pay | Admitting: Internal Medicine

## 2024-06-04 ENCOUNTER — Ambulatory Visit: Payer: Self-pay | Admitting: Internal Medicine

## 2024-06-04 DIAGNOSIS — R946 Abnormal results of thyroid function studies: Secondary | ICD-10-CM

## 2024-06-04 LAB — RENAL FUNCTION PANEL
Albumin: 4.7 g/dL (ref 3.5–5.2)
BUN: 14 mg/dL (ref 6–23)
CO2: 28 meq/L (ref 19–32)
Calcium: 9.6 mg/dL (ref 8.4–10.5)
Chloride: 102 meq/L (ref 96–112)
Creatinine, Ser: 0.85 mg/dL (ref 0.40–1.20)
GFR: 77.74 mL/min (ref >60.00)
Glucose, Bld: 78 mg/dL (ref 70–99)
Phosphorus: 3.7 mg/dL (ref 2.3–4.6)
Potassium: 4.3 meq/L (ref 3.5–5.1)
Sodium: 140 meq/L (ref 135–145)

## 2024-06-04 LAB — HEPATIC FUNCTION PANEL
ALT: 13 U/L (ref 0–35)
AST: 27 U/L (ref 0–37)
Albumin: 4.7 g/dL (ref 3.5–5.2)
Alkaline Phosphatase: 78 U/L (ref 39–117)
Bilirubin, Direct: 0.1 mg/dL (ref 0.0–0.3)
Total Bilirubin: 0.7 mg/dL (ref 0.2–1.2)
Total Protein: 7.6 g/dL (ref 6.0–8.3)

## 2024-06-04 LAB — LIPID PANEL
Cholesterol: 233 mg/dL — ABNORMAL HIGH (ref 0–200)
HDL: 75.4 mg/dL (ref >39.00)
LDL Cholesterol: 144 mg/dL — ABNORMAL HIGH (ref 0–99)
NonHDL: 157.31
Total CHOL/HDL Ratio: 3
Triglycerides: 66 mg/dL (ref 0.0–149.0)
VLDL: 13.2 mg/dL (ref 0.0–40.0)

## 2024-06-04 LAB — CBC
HCT: 42 % (ref 36.0–46.0)
Hemoglobin: 14.2 g/dL (ref 12.0–15.0)
MCHC: 33.8 g/dL (ref 30.0–36.0)
MCV: 90.6 fl (ref 78.0–100.0)
Platelets: 311 K/uL (ref 150.0–400.0)
RBC: 4.64 Mil/uL (ref 3.87–5.11)
RDW: 13.1 % (ref 11.5–15.5)
WBC: 5.8 K/uL (ref 4.0–10.5)

## 2024-06-04 LAB — TSH: TSH: 10 u[IU]/mL — ABNORMAL HIGH (ref 0.35–5.50)

## 2024-06-04 LAB — VITAMIN B12: Vitamin B-12: 187 pg/mL — ABNORMAL LOW (ref 211–911)

## 2024-06-05 ENCOUNTER — Other Ambulatory Visit: Payer: Self-pay | Admitting: Internal Medicine

## 2024-06-05 ENCOUNTER — Other Ambulatory Visit (INDEPENDENT_AMBULATORY_CARE_PROVIDER_SITE_OTHER)

## 2024-06-05 DIAGNOSIS — R946 Abnormal results of thyroid function studies: Secondary | ICD-10-CM

## 2024-06-05 LAB — TSH: TSH: 7.19 u[IU]/mL — ABNORMAL HIGH (ref 0.35–5.50)

## 2024-06-05 LAB — T4, FREE: Free T4: 0.93 ng/dL (ref 0.60–1.60)

## 2024-06-10 ENCOUNTER — Ambulatory Visit: Payer: Self-pay | Admitting: Family

## 2024-06-10 NOTE — Progress Notes (Signed)
 LVM to schedule apt to follow up on Thyroid  function

## 2024-06-13 ENCOUNTER — Ambulatory Visit

## 2024-06-13 ENCOUNTER — Telehealth: Payer: Self-pay

## 2024-06-13 VITALS — BP 104/70 | HR 74 | Temp 98.2°F | Ht 65.0 in | Wt 183.0 lb

## 2024-06-13 DIAGNOSIS — I471 Supraventricular tachycardia, unspecified: Secondary | ICD-10-CM | POA: Diagnosis not present

## 2024-06-13 DIAGNOSIS — E038 Other specified hypothyroidism: Secondary | ICD-10-CM

## 2024-06-13 DIAGNOSIS — Z131 Encounter for screening for diabetes mellitus: Secondary | ICD-10-CM | POA: Diagnosis not present

## 2024-06-13 DIAGNOSIS — E538 Deficiency of other specified B group vitamins: Secondary | ICD-10-CM | POA: Diagnosis not present

## 2024-06-13 LAB — HEMOGLOBIN A1C: Hgb A1c MFr Bld: 5.6 % (ref 4.6–6.5)

## 2024-06-13 MED ORDER — LEVOTHYROXINE SODIUM 137 MCG PO TABS: 137.0000 ug | ORAL_TABLET | Freq: Every day | ORAL | 0 refills | Status: DC

## 2024-06-13 NOTE — Telephone Encounter (Signed)
 Could you please call patient and schedule her for a lab visit 1 week prior to her appointment with me on 10/29?  Thanks

## 2024-06-13 NOTE — Progress Notes (Signed)
 Subjective:    Patient ID: Bari DELENA Hoar, female    DOB: July 08, 1971, 53 y.o.   MRN: [id number]   DEBHORA TITUS is a very pleasant 53 y.o. female who presents today to discuss her thyroid .  She endorses weight gain despite no significant diet changes, endorses brittle hair as well as hair thinning. Says regarding her B12 levels, she was told they are low, she has just started taking the oral supplement, she does not want to start injections at this time.  Does state that every morning when she wakes up she feels tingling in her toes, mild. Since regarding her SVT, she saw a cardiologist with [facility], but several years ago.  She does not currently take any medication for it, does endorse episodes of palpitations associated with mild chest pain, sometimes lightheadedness, lasting about 15 to 30 minutes, says she has an episode maybe once every other week.  Denies any current symptoms at this time.   Review of Systems  All other systems reviewed and are negative.        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[place]  Tobacco Use   Smoking status: Never   Smokeless tobacco: Never  Vaping Use   Vaping status: Never Used  Substance and Sexual Activity   Alcohol use: No   Drug use: No   Sexual activity: Not on file  Other Topics Concern   Not on file  Social History Narrative   Not on file   Social Drivers of Health   Financial Resource Strain: Not on file  Food Insecurity: Not on file  Transportation Needs: Not on file  Physical  Activity: Not on file  Stress: Not on file  Social Connections: Not on file  Intimate Partner Violence: Not on file    Past Surgical History:  Procedure Laterality Date   APPENDECTOMY  2011   wrapped around ovary , saved Ovary-    CESAREAN SECTION  1993, 1996   x 2   FEMORAL HERNIA REPAIR  ~1978   left ?    Family History  Problem Relation Age of Onset   Colon polyps Mother    Ovarian cancer Mother        caught early   Hyperthyroidism Maternal Uncle    Coronary artery disease Maternal Uncle        LAD stent   Diabetes Paternal Uncle    Heart disease Maternal Grandmother        died of sudden MI   Other Maternal Grandfather        4 tumors behind intestine but were benign, no cancer- used radiation to shrink intestines    Hyperthyroidism Nephew    Diabetes Other        very prevalent   Cancer Neg Hx    Colon cancer Neg Hx    Esophageal cancer Neg Hx    Stomach cancer Neg Hx    Rectal  cancer Neg Hx     Allergies  Allergen Reactions   Erythromycin Base Diarrhea   Morphine And Codeine Nausea And Vomiting    And excessive sweating     Current Outpatient Medications on File Prior to Visit  Medication Sig Dispense Refill   cyanocobalamin  1000 MCG tablet Take 1,000 mcg by mouth every other day.     cyclobenzaprine  (FLEXERIL ) 10 MG tablet Take 1 tablet (10 mg total) by mouth at bedtime as needed for muscle spasms. 15 tablet 0   hydrocortisone  2.5 % cream Apply topically 3 (three) times daily as needed. 28 g 3   ketoconazole  (NIZORAL ) 2 % cream Apply 1 Application topically daily. At bedtime 60 g 3   Multiple Vitamin (MULTIVITAMIN) tablet Take 1 tablet by mouth daily.     neomycin -polymyxin-hydrocortisone  (CORTISPORIN) 3.5-10000-1 OTIC suspension Place 4 drops into the right ear 4 (four) times daily. 10 mL 1   SUMAtriptan  (IMITREX ) 50 MG tablet TAKE 1 TABLET BY MOUTH AS NEEDED FOR MIGRAINE MAY REPEAT IN 2 HOURS IF HEADACHE PERSISTS OR RECURS 9 tablet 11   triamcinolone   cream (KENALOG ) 0.1 % Apply 1 Application topically 2 (two) times daily as needed. 45 g 1   No current facility-administered medications on file prior to visit.    BP 104/70 (BP Location: Left Arm, Patient Position: Sitting, Cuff Size: Large)   Pulse 74   Temp 98.2 F (36.8 C) (Oral)   Ht 5' 5 (1.651 m)   Wt 183 lb (83 kg)   LMP  (LMP Unknown)   SpO2 98%   BMI 30.45 kg/m   Objective:    Physical Exam Vitals and nursing note reviewed.  Constitutional:      Appearance: Normal appearance.  HENT:     Head: Normocephalic and atraumatic.  Eyes:     Extraocular Movements: Extraocular movements intact.     Conjunctiva/sclera: Conjunctivae normal.  Neck:     Thyroid : No thyroid  mass, thyromegaly or thyroid  tenderness.  Cardiovascular:     Rate and Rhythm: Normal rate and regular rhythm.     Heart sounds: No murmur heard. Skin:    General: Skin is warm.  Neurological:     Mental Status: She is alert.  Psychiatric:        Mood and Affect: Mood normal.        Behavior: Behavior normal.            Assessment & Plan:   1. Subclinical hypothyroidism (Primary) TSH from 10 days ago was significantly elevated at 10, repeat 2 days later was 7.19.  Free T4 was WNL.  Patient is symptomatic, given the significant elevation of TSH as well as her age, we will treat with levothyroxine  to prevent adverse cardiovascular outcomes.  Clinical exam of the thyroid  is benign, no indication for thyroid  US  at this time.  Most likely cause would be Hashimoto's thyroiditis, will order thyroid  peroxidase antibody.  Plan to repeat TSH and 8 weeks and augment levothyroxine  dose if needed.  - levothyroxine  (SYNTHROID ) 137 MCG tablet; Take 1 tablet (137 mcg total) by mouth daily before breakfast.  Dispense: 60 tablet; Refill: 0 - TSH; Future - Thyroid  peroxidase antibody  2. B12 deficiency Recent B12 level was mildly low at 187, unclear whether patient is truly having neuropathy symptoms given her  complaint of mild tingling in the toes, counseled her to continue oral supplementation, with plans to repeat levels in about 8 weeks, if not improved, would start B12 injections.  - Vitamin  B12; Future - Continue oral B12 supplementation  3. SVT (supraventricular tachycardia) (HCC) Patient reportedly was evaluated by cardiologist several years ago, there is no documentation of this per chart review.  However, provided patient with heart care office number to call and schedule an appointment.  Cardiovascular exam is benign and VSS, however, she would benefit from cardiology evaluation given her intermittent symptoms.  Will continue to monitor.  4. Diabetes mellitus screening Patient did recently have CPE with prior PCP, however, per chart review has not had DM screening, ordered at this visit.  - Hemoglobin A1c   Return in about 8 weeks (around 08/08/2024).   Tandre Conly K Jasie Meleski, MD  06/13/24

## 2024-06-13 NOTE — Patient Instructions (Addendum)
 Thank you for visiting [facility] today! Here's what we talked about: - Call [facility] and schedule an appointment -Take thyroid  med 1hr before food/other meds -Get your repeat lab done 1wk before your next visit

## 2024-06-16 ENCOUNTER — Ambulatory Visit: Payer: Self-pay

## 2024-06-23 NOTE — Telephone Encounter (Signed)
 lvm for pt to call office to schedule appt.

## 2024-06-27 ENCOUNTER — Other Ambulatory Visit: Payer: Self-pay

## 2024-06-27 NOTE — Telephone Encounter (Signed)
 Last filled 02-20-24 #15 Last OV 06-13-24 Next OV 08-06-24 [facility] [address]

## 2024-07-04 ENCOUNTER — Encounter

## 2024-07-29 ENCOUNTER — Other Ambulatory Visit (INDEPENDENT_AMBULATORY_CARE_PROVIDER_SITE_OTHER)

## 2024-07-29 DIAGNOSIS — E038 Other specified hypothyroidism: Secondary | ICD-10-CM | POA: Diagnosis not present

## 2024-07-29 DIAGNOSIS — E538 Deficiency of other specified B group vitamins: Secondary | ICD-10-CM

## 2024-07-29 LAB — TSH: TSH: 0.02 u[IU]/mL — ABNORMAL LOW (ref 0.35–5.50)

## 2024-07-29 LAB — VITAMIN B12: Vitamin B-12: 292 pg/mL (ref 211–911)

## 2024-07-30 ENCOUNTER — Telehealth: Payer: Self-pay

## 2024-07-30 NOTE — Telephone Encounter (Signed)
 Copied from CRM [id number]. Topic: Clinical - Medication Question >> Jul 30, 2024 10:08 AM Macario HERO wrote: Reason for CRM: Patient called requesting a call back from Dr. Charlyn nurse regarding her thyroid  medication: levothyroxine  (SYNTHROID ) 137 MCG tablet [[id number]].

## 2024-07-30 NOTE — Telephone Encounter (Signed)
 Pt is asking should she stop the levothyroxine  until her appointment next week.

## 2024-07-31 LAB — THYROID PEROXIDASE ANTIBODY: Thyroperoxidase Ab SerPl-aCnc: 169 [IU]/mL — ABNORMAL HIGH (ref <9)

## 2024-08-06 ENCOUNTER — Ambulatory Visit

## 2024-08-06 VITALS — BP 132/80 | HR 84 | Temp 98.1°F | Ht 65.0 in | Wt 182.0 lb

## 2024-08-06 DIAGNOSIS — E063 Autoimmune thyroiditis: Secondary | ICD-10-CM | POA: Diagnosis not present

## 2024-08-06 MED ORDER — LEVOTHYROXINE SODIUM 125 MCG PO TABS: 125.0000 ug | ORAL_TABLET | Freq: Every day | ORAL | 3 refills | Status: DC

## 2024-08-06 NOTE — Progress Notes (Signed)
 Subjective:   This visit was conducted in person. The patient gave informed consent to the use of Abridge AI technology to record the contents of the encounter as documented below.   Patient ID: Erin Fuentes, female    DOB: [birth year], 53 y.o.   MRN: [id number]   Discussed the use of AI scribe software for clinical note transcription with the patient, who gave verbal consent to proceed.  History of Present Illness Erin Fuentes is a 53 year old female with Hashimoto's thyroiditis who presents for medication adjustment and follow-up.  She has been taking Synthroid , 137 mcg, one hour before food or other medications. Thyroid  labs will be repeated in eight weeks to monitor TSH levels.  She has no prior history of thyroid  issues and is unsure of the cause, speculating it could be genetic or related to menopause. There is a family history of thyroid  issues, with an uncle having thyroid  problems and a nephew having had part of his thyroid  removed. Her thyroid  peroxidase antibody level was significantly elevated at 169.  She is concerned about potential heart issues related to her thyroid  condition, noting occasional palpitations that are not frequent or constant.    Review of Systems  All other systems reviewed and are negative.       Allergies  Allergen Reactions   Erythromycin Base Diarrhea   Morphine And Codeine Nausea And Vomiting    And excessive sweating     Current Outpatient Medications on File Prior to Visit  Medication Sig Dispense Refill   cyanocobalamin  1000 MCG tablet Take 1,000 mcg by mouth every other day.     hydrocortisone  2.5 % cream Apply topically 3 (three) times daily as needed. 28 g 3   ketoconazole  (NIZORAL ) 2 % cream Apply 1 Application topically daily. At bedtime 60 g 3   Multiple Vitamin (MULTIVITAMIN) tablet Take 1 tablet by mouth daily.     neomycin -polymyxin-hydrocortisone  (CORTISPORIN) 3.5-10000-1 OTIC suspension Place 4 drops into the right  ear 4 (four) times daily. 10 mL 1   SUMAtriptan  (IMITREX ) 50 MG tablet TAKE 1 TABLET BY MOUTH AS NEEDED FOR MIGRAINE MAY REPEAT IN 2 HOURS IF HEADACHE PERSISTS OR RECURS 9 tablet 11   triamcinolone  cream (KENALOG ) 0.1 % Apply 1 Application topically 2 (two) times daily as needed. 45 g 1   No current facility-administered medications on file prior to visit.    BP 132/80 (BP Location: Left Arm, Patient Position: Sitting, Cuff Size: Large)   Pulse 84   Temp 98.1 F (36.7 C) (Oral)   Ht 5' 5 (1.651 m)   Wt 182 lb (82.6 kg)   SpO2 97%   BMI 30.29 kg/m   Objective:      Physical Exam GENERAL: Alert, cooperative, well developed, no acute distress. HEAD: Normocephalic atraumatic. CARDIOVASCULAR: Normal heart rate and rhythm, S1 and S2 normal without murmurs, heart sounds normal, no abnormal sounds detected. CHEST: Clear to auscultation bilaterally, no wheezes, rhonchi, or crackles. NEUROLOGICAL: Oriented to person, place and time, no gait abnormalities, moves all extremities without gross motor or sensory deficit. NECK: Neck examined, no tenderness, no thyroid  nodules or lumps palpated. No thyromegaly       Assessment & Plan:   Assessment & Plan Hashimoto's thyroiditis (autoimmune hypothyroidism) Confirmed by elevated thyroid  peroxidase antibody level. Current TSH low, indicating over-treatment with levothyroxine . Discussed autoimmune nature, genetic predisposition, and relatively benign nature. Discussed appropriate synthroid  dosing  - Reduce levothyroxine  dose to 125 mcg daily. - Repeat TSH in  8 weeks. - Provide educational printout on Hashimoto's thyroiditis. - F/u with cardiology for intermittent sx of SVT as this could be exacerbated by thyroid  problems    Return in about 10 months (around 06/04/2025) for CPE, schedule fasting lab visit 1week before. Schedule thyroid  lab visit 58mo from now..   Priscella Donna K Nazariah Cadet, MD  08/06/24     Contains text generated by Abridge.

## 2024-08-06 NOTE — Patient Instructions (Addendum)
 Thank you for visiting [facility] today! Here's what we talked about: - Stop Levothyroxine  and start new dose at 125mcg - Call cardiology and schedule appt

## 2024-09-09 ENCOUNTER — Other Ambulatory Visit

## 2024-09-16 ENCOUNTER — Other Ambulatory Visit

## 2024-09-16 DIAGNOSIS — E063 Autoimmune thyroiditis: Secondary | ICD-10-CM

## 2024-09-16 LAB — TSH: TSH: 0.06 u[IU]/mL — ABNORMAL LOW (ref 0.35–5.50)

## 2024-09-17 ENCOUNTER — Ambulatory Visit: Payer: Self-pay

## 2024-09-17 DIAGNOSIS — E063 Autoimmune thyroiditis: Secondary | ICD-10-CM

## 2024-09-17 MED ORDER — LEVOTHYROXINE SODIUM 112 MCG PO TABS: 112.0000 ug | ORAL_TABLET | Freq: Every day | ORAL | 0 refills | Status: AC

## 2024-11-14 ENCOUNTER — Other Ambulatory Visit: Payer: Self-pay

## 2024-12-26 ENCOUNTER — Other Ambulatory Visit: Payer: Self-pay

## 2025-05-28 ENCOUNTER — Other Ambulatory Visit

## 2025-06-04 ENCOUNTER — Encounter

## 2025-06-05 ENCOUNTER — Ambulatory Visit
